# Patient Record
Sex: Male | Born: 1970 | Race: Black or African American | Hispanic: No | Marital: Single | Smoking: Current every day smoker
Health system: Southern US, Community
[De-identification: ages and names within clinical notes are randomized; demographics above are authoritative.]

## PROBLEM LIST (undated history)

## (undated) DIAGNOSIS — K852 Alcohol induced acute pancreatitis without necrosis or infection: Secondary | ICD-10-CM

## (undated) DIAGNOSIS — K219 Gastro-esophageal reflux disease without esophagitis: Secondary | ICD-10-CM

## (undated) HISTORY — PX: OTHER SURGICAL HISTORY: SHX169

---

## 2012-10-28 ENCOUNTER — Emergency Department: Payer: Self-pay | Admitting: Emergency Medicine

## 2012-10-28 LAB — URINALYSIS, COMPLETE
Bacteria: NONE SEEN
Bilirubin,UR: NEGATIVE
Blood: NEGATIVE
Nitrite: NEGATIVE
Protein: 30
RBC,UR: NONE SEEN /HPF (ref 0–5)
Specific Gravity: 1.023 (ref 1.003–1.030)

## 2012-10-28 LAB — COMPREHENSIVE METABOLIC PANEL
Albumin: 4.3 g/dL (ref 3.4–5.0)
Alkaline Phosphatase: 63 U/L (ref 50–136)
Anion Gap: 8 (ref 7–16)
BUN: 14 mg/dL (ref 7–18)
Bilirubin,Total: 0.7 mg/dL (ref 0.2–1.0)
Chloride: 104 mmol/L (ref 98–107)
Glucose: 93 mg/dL (ref 65–99)
Osmolality: 276 (ref 275–301)
SGOT(AST): 34 U/L (ref 15–37)
SGPT (ALT): 38 U/L (ref 12–78)
Total Protein: 8.7 g/dL — ABNORMAL HIGH (ref 6.4–8.2)

## 2012-10-28 LAB — CBC
HCT: 48.4 % (ref 40.0–52.0)
HGB: 16.5 g/dL (ref 13.0–18.0)
MCH: 32.4 pg (ref 26.0–34.0)
Platelet: 187 10*3/uL (ref 150–440)

## 2013-03-15 ENCOUNTER — Emergency Department: Payer: Self-pay | Admitting: Emergency Medicine

## 2016-12-19 ENCOUNTER — Inpatient Hospital Stay
Admission: EM | Admit: 2016-12-19 | Discharge: 2016-12-24 | DRG: 439 | Disposition: A | Discharge status: Home / Self Care | Payer: BLUE CROSS/BLUE SHIELD | Attending: Internal Medicine | Admitting: Internal Medicine

## 2016-12-19 ENCOUNTER — Encounter: Payer: Self-pay | Admitting: Emergency Medicine

## 2016-12-19 ENCOUNTER — Emergency Department: Payer: BLUE CROSS/BLUE SHIELD

## 2016-12-19 DIAGNOSIS — E876 Hypokalemia: Secondary | ICD-10-CM | POA: Diagnosis not present

## 2016-12-19 DIAGNOSIS — K852 Alcohol induced acute pancreatitis without necrosis or infection: Secondary | ICD-10-CM | POA: Diagnosis present

## 2016-12-19 DIAGNOSIS — E860 Dehydration: Secondary | ICD-10-CM | POA: Diagnosis present

## 2016-12-19 DIAGNOSIS — K219 Gastro-esophageal reflux disease without esophagitis: Secondary | ICD-10-CM | POA: Diagnosis present

## 2016-12-19 DIAGNOSIS — R030 Elevated blood-pressure reading, without diagnosis of hypertension: Secondary | ICD-10-CM | POA: Diagnosis present

## 2016-12-19 DIAGNOSIS — K859 Acute pancreatitis without necrosis or infection, unspecified: Secondary | ICD-10-CM | POA: Diagnosis present

## 2016-12-19 DIAGNOSIS — R109 Unspecified abdominal pain: Secondary | ICD-10-CM

## 2016-12-19 DIAGNOSIS — F1010 Alcohol abuse, uncomplicated: Secondary | ICD-10-CM | POA: Diagnosis present

## 2016-12-19 DIAGNOSIS — Z79899 Other long term (current) drug therapy: Secondary | ICD-10-CM

## 2016-12-19 DIAGNOSIS — F17200 Nicotine dependence, unspecified, uncomplicated: Secondary | ICD-10-CM | POA: Diagnosis present

## 2016-12-19 DIAGNOSIS — R739 Hyperglycemia, unspecified: Secondary | ICD-10-CM | POA: Diagnosis present

## 2016-12-19 DIAGNOSIS — G4700 Insomnia, unspecified: Secondary | ICD-10-CM | POA: Diagnosis present

## 2016-12-19 DIAGNOSIS — E871 Hypo-osmolality and hyponatremia: Secondary | ICD-10-CM | POA: Diagnosis present

## 2016-12-19 DIAGNOSIS — K5900 Constipation, unspecified: Secondary | ICD-10-CM

## 2016-12-19 HISTORY — DX: Gastro-esophageal reflux disease without esophagitis: K21.9

## 2016-12-19 LAB — URINALYSIS, COMPLETE (UACMP) WITH MICROSCOPIC
BACTERIA UA: NONE SEEN
BILIRUBIN URINE: NEGATIVE
Glucose, UA: >=500 mg/dL — AB
Hgb urine dipstick: NEGATIVE
KETONES UR: 5 mg/dL — AB
LEUKOCYTES UA: NEGATIVE
Nitrite: NEGATIVE
PROTEIN: 30 mg/dL — AB
RBC / HPF: NONE SEEN RBC/hpf (ref 0–5)
SQUAMOUS EPITHELIAL / LPF: NONE SEEN
Specific Gravity, Urine: 1.018 (ref 1.005–1.030)
pH: 5 (ref 5.0–8.0)

## 2016-12-19 LAB — COMPREHENSIVE METABOLIC PANEL
ALBUMIN: 4.6 g/dL (ref 3.5–5.0)
ALT: 47 U/L (ref 17–63)
ANION GAP: 9 (ref 5–15)
AST: 36 U/L (ref 15–41)
Alkaline Phosphatase: 48 U/L (ref 38–126)
BILIRUBIN TOTAL: 1 mg/dL (ref 0.3–1.2)
BUN: 8 mg/dL (ref 6–20)
CHLORIDE: 101 mmol/L (ref 101–111)
CO2: 24 mmol/L (ref 22–32)
Calcium: 9.2 mg/dL (ref 8.9–10.3)
Creatinine, Ser: 0.97 mg/dL (ref 0.61–1.24)
GFR calc Af Amer: >60 mL/min (ref >60)
GFR calc non Af Amer: >60 mL/min (ref >60)
Glucose, Bld: 130 mg/dL — ABNORMAL HIGH (ref 65–99)
POTASSIUM: 3.5 mmol/L (ref 3.5–5.1)
SODIUM: 134 mmol/L — AB (ref 135–145)
Total Protein: 7.8 g/dL (ref 6.5–8.1)

## 2016-12-19 LAB — CBC
HEMATOCRIT: 47.8 % (ref 40.0–52.0)
Hemoglobin: 16.4 g/dL (ref 13.0–18.0)
MCH: 32.9 pg (ref 26.0–34.0)
MCHC: 34.4 g/dL (ref 32.0–36.0)
MCV: 95.8 fL (ref 80.0–100.0)
Platelets: 199 10*3/uL (ref 150–440)
RBC: 4.99 MIL/uL (ref 4.40–5.90)
RDW: 13.1 % (ref 11.5–14.5)
WBC: 13.7 10*3/uL — AB (ref 3.8–10.6)

## 2016-12-19 LAB — LIPASE, BLOOD: LIPASE: 1587 U/L — AB (ref 11–51)

## 2016-12-19 MED ORDER — HYDRALAZINE HCL 20 MG/ML IJ SOLN: 10.0000 mg | Freq: Four times a day (QID) | INTRAMUSCULAR | Status: DC | PRN

## 2016-12-19 MED ORDER — FENTANYL CITRATE (PF) 100 MCG/2ML IJ SOLN
50.0000 ug | INTRAMUSCULAR | Status: DC | PRN
  Administered 2016-12-19: 50 ug via INTRAVENOUS

## 2016-12-19 MED ORDER — SODIUM CHLORIDE 0.9 % IV SOLN
INTRAVENOUS | Status: DC
  Administered 2016-12-19 – 2016-12-24 (×17): via INTRAVENOUS

## 2016-12-19 MED ORDER — LORAZEPAM 2 MG/ML IJ SOLN
1.0000 mg | Freq: Four times a day (QID) | INTRAMUSCULAR | Status: AC | PRN
  Administered 2016-12-19 – 2016-12-22 (×5): 1 mg via INTRAVENOUS
  Filled 2016-12-19 (×6): qty 1

## 2016-12-19 MED ORDER — ACETAMINOPHEN 650 MG RE SUPP: 650.0000 mg | Freq: Four times a day (QID) | RECTAL | Status: DC | PRN

## 2016-12-19 MED ORDER — ADULT MULTIVITAMIN W/MINERALS CH
1.0000 | ORAL_TABLET | Freq: Every day | ORAL | Status: DC
  Administered 2016-12-19 – 2016-12-24 (×6): 1 via ORAL
  Filled 2016-12-19 (×6): qty 1

## 2016-12-19 MED ORDER — ONDANSETRON HCL 4 MG/2ML IJ SOLN
4.0000 mg | Freq: Once | INTRAMUSCULAR | Status: AC
  Administered 2016-12-19: 4 mg via INTRAVENOUS

## 2016-12-19 MED ORDER — MORPHINE SULFATE (PF) 4 MG/ML IV SOLN
4.0000 mg | Freq: Once | INTRAVENOUS | Status: AC
  Administered 2016-12-19: 4 mg via INTRAVENOUS
  Filled 2016-12-19: qty 1

## 2016-12-19 MED ORDER — ONDANSETRON HCL 4 MG PO TABS: 4.0000 mg | ORAL_TABLET | Freq: Four times a day (QID) | ORAL | Status: DC | PRN

## 2016-12-19 MED ORDER — BISACODYL 5 MG PO TBEC
5.0000 mg | DELAYED_RELEASE_TABLET | Freq: Every day | ORAL | Status: DC | PRN
  Administered 2016-12-22: 5 mg via ORAL
  Filled 2016-12-19: qty 1

## 2016-12-19 MED ORDER — ACETAMINOPHEN 325 MG PO TABS
650.0000 mg | ORAL_TABLET | Freq: Four times a day (QID) | ORAL | Status: DC | PRN
  Administered 2016-12-19 – 2016-12-21 (×2): 650 mg via ORAL
  Filled 2016-12-19 (×2): qty 2

## 2016-12-19 MED ORDER — HYDROMORPHONE HCL 1 MG/ML IJ SOLN
INTRAMUSCULAR | Status: AC
  Filled 2016-12-19: qty 1

## 2016-12-19 MED ORDER — FENTANYL CITRATE (PF) 100 MCG/2ML IJ SOLN
INTRAMUSCULAR | Status: AC
  Filled 2016-12-19: qty 2

## 2016-12-19 MED ORDER — SENNOSIDES-DOCUSATE SODIUM 8.6-50 MG PO TABS
1.0000 | ORAL_TABLET | Freq: Every evening | ORAL | Status: DC | PRN
  Administered 2016-12-21: 1 via ORAL
  Filled 2016-12-19: qty 1

## 2016-12-19 MED ORDER — THIAMINE HCL 100 MG/ML IJ SOLN: 100.0000 mg | Freq: Every day | INTRAMUSCULAR | Status: DC

## 2016-12-19 MED ORDER — VITAMIN B-1 100 MG PO TABS
100.0000 mg | ORAL_TABLET | Freq: Every day | ORAL | Status: DC
  Administered 2016-12-19 – 2016-12-24 (×6): 100 mg via ORAL
  Filled 2016-12-19 (×6): qty 1

## 2016-12-19 MED ORDER — SODIUM CHLORIDE 0.9 % IV BOLUS (SEPSIS)
1000.0000 mL | Freq: Once | INTRAVENOUS | Status: AC
  Administered 2016-12-19: 1000 mL via INTRAVENOUS

## 2016-12-19 MED ORDER — LORAZEPAM 2 MG/ML IJ SOLN
0.0000 mg | Freq: Four times a day (QID) | INTRAMUSCULAR | Status: AC
  Administered 2016-12-19 – 2016-12-20 (×3): 1 mg via INTRAVENOUS
  Administered 2016-12-20: 2 mg via INTRAVENOUS
  Administered 2016-12-20: 1 mg via INTRAVENOUS
  Administered 2016-12-21: 2 mg via INTRAVENOUS
  Filled 2016-12-19 (×6): qty 1

## 2016-12-19 MED ORDER — LORAZEPAM 2 MG/ML IJ SOLN
0.0000 mg | Freq: Two times a day (BID) | INTRAMUSCULAR | Status: AC
  Administered 2016-12-21 – 2016-12-22 (×2): 1 mg via INTRAVENOUS
  Filled 2016-12-19: qty 2

## 2016-12-19 MED ORDER — ENOXAPARIN SODIUM 40 MG/0.4ML ~~LOC~~ SOLN
40.0000 mg | SUBCUTANEOUS | Status: DC
  Filled 2016-12-19 (×4): qty 0.4

## 2016-12-19 MED ORDER — FOLIC ACID 1 MG PO TABS
1.0000 mg | ORAL_TABLET | Freq: Every day | ORAL | Status: DC
  Administered 2016-12-19 – 2016-12-24 (×6): 1 mg via ORAL
  Filled 2016-12-19 (×6): qty 1

## 2016-12-19 MED ORDER — HYDROMORPHONE HCL 1 MG/ML IJ SOLN
1.0000 mg | INTRAMUSCULAR | Status: DC | PRN
  Administered 2016-12-19 – 2016-12-23 (×21): 1 mg via INTRAVENOUS
  Filled 2016-12-19 (×21): qty 1

## 2016-12-19 MED ORDER — LORAZEPAM 1 MG PO TABS
1.0000 mg | ORAL_TABLET | Freq: Four times a day (QID) | ORAL | Status: AC | PRN
  Filled 2016-12-19 (×2): qty 1

## 2016-12-19 MED ORDER — ONDANSETRON HCL 4 MG/2ML IJ SOLN
INTRAMUSCULAR | Status: AC
  Filled 2016-12-19: qty 2

## 2016-12-19 MED ORDER — HYDROMORPHONE HCL 1 MG/ML IJ SOLN
1.0000 mg | Freq: Once | INTRAMUSCULAR | Status: AC
  Administered 2016-12-19: 1 mg via INTRAVENOUS

## 2016-12-19 MED ORDER — ONDANSETRON HCL 4 MG/2ML IJ SOLN: 4.0000 mg | Freq: Four times a day (QID) | INTRAMUSCULAR | Status: DC | PRN

## 2016-12-19 MED ORDER — METOPROLOL TARTRATE 5 MG/5ML IV SOLN
5.0000 mg | INTRAVENOUS | Status: DC | PRN
  Administered 2016-12-20 – 2016-12-23 (×5): 5 mg via INTRAVENOUS
  Filled 2016-12-19 (×5): qty 5

## 2016-12-19 NOTE — ED Notes (Signed)
Pt continually removing blood pressure cuff. Pt instructed to keep cuff in place for monitoring.

## 2016-12-19 NOTE — ED Notes (Signed)
Pt complains of continued pain and is requesting additional pain medication.

## 2016-12-19 NOTE — ED Notes (Signed)
Pt's significant other out to desk to report pt continues to have pain 8/10. md notified, order for additional morphine 4mg  iv received.

## 2016-12-19 NOTE — ED Provider Notes (Signed)
[facility] Emergency Department Provider Note   ____________________________________________   First MD Initiated Contact with Patient 12/19/16 0402     (approximate)  I have reviewed the triage vital signs and the nursing notes.   HISTORY  Chief Complaint Abdominal Pain    HPI Don Thompson is a 46 y.o. male who comes into the hospital today with abdominal pain for the past 3 days. The patient has also had some sweats with nausea and vomiting. He denies any blood in his emesis but does have a history of ulcers. The patient's significant other reports that last September he had some pain but this is much worse the patient reports that he was drinking a lot prior to this pain starting. He reports that he's been drinking a lot of beer but its never been this bad in the past. The patient reports that he started taking some omeprazole, baking soda with vinegar and Tylenol but it did not help his pain. The patient was pain at 10 out of 10 in intensity. It is in his epigastric area and radiates into his back. The patient is here today for evaluation.   Past Medical History:  Diagnosis Date  . GERD (gastroesophageal reflux disease)     There are no active problems to display for this patient.   History reviewed. No pertinent surgical history.  Prior to Admission medications   Medication Sig Start Date End Date Taking? Authorizing Provider  famotidine (PEPCID) 20 MG tablet Take 20 mg by mouth daily.   Yes Historical Provider, MD  omeprazole (PRILOSEC) 20 MG capsule Take 20 mg by mouth daily.   Yes Historical Provider, MD    Allergies Patient has no known allergies.  History reviewed. No pertinent family history.  Social History Social History  Substance Use Topics  . Smoking status: Current Every Day Smoker  . Smokeless tobacco: Never Used  . Alcohol use Yes    Review of Systems  Constitutional: No fever/chills Eyes: No visual  changes. ENT: No sore throat. Cardiovascular: Denies chest pain. Respiratory: Denies shortness of breath. Gastrointestinal:  abdominal pain.   nausea,  vomiting.  No diarrhea.  No constipation. Genitourinary: Negative for dysuria. Musculoskeletal: Negative for back pain. Skin: Negative for rash. Neurological: Negative for headaches, focal weakness or numbness.   ____________________________________________   PHYSICAL EXAM:  VITAL SIGNS: ED Triage Vitals [12/19/16 0228]  Enc Vitals Group     BP (!) 118/53     Pulse Rate 89     Resp (!) 24     Temp 98.7 F (37.1 C)     Temp Source Oral     SpO2 96 %     Weight 210 lb (95.3 kg)     Height 5\' 5"  (1.651 m)     Head Circumference      Peak Flow      Pain Score 10     Pain Loc      Pain Edu?      Excl. in GC?    Constitutional: Alert and oriented. Well appearing and in moderate distress. Eyes: Conjunctivae are normal. PERRL. EOMI. Head: Atraumatic. Nose: No congestion/rhinnorhea. Mouth/Throat: Mucous membranes are moist.  Oropharynx non-erythematous. Cardiovascular: Normal rate, regular rhythm. Grossly normal heart sounds.  Good peripheral circulation. Respiratory: Normal respiratory effort.  No retractions. Lungs CTAB. Gastrointestinal: Soft with epigastric abd pain to palpation No distention. Positive bowel sounds Musculoskeletal: No lower extremity tenderness nor edema.   Neurologic:  Normal speech and language.  Skin:  Skin is warm, dry and intact.  Psychiatric: Mood and affect are normal.   ____________________________________________   LABS (all labs ordered are listed, but only abnormal results are displayed)  Labs Reviewed  LIPASE, BLOOD - Abnormal; Notable for the following:       Result Value   Lipase 1,587 (*)    All other components within normal limits  COMPREHENSIVE METABOLIC PANEL - Abnormal; Notable for the following:    Sodium 134 (*)    Glucose, Bld 130 (*)    All other components within normal  limits  CBC - Abnormal; Notable for the following:    WBC 13.7 (*)    All other components within normal limits  URINALYSIS, COMPLETE (UACMP) WITH MICROSCOPIC - Abnormal; Notable for the following:    Color, Urine YELLOW (*)    APPearance CLEAR (*)    Glucose, UA >=500 (*)    Ketones, ur 5 (*)    Protein, ur 30 (*)    All other components within normal limits   ____________________________________________  EKG  none ____________________________________________  RADIOLOGY  [place] abd ____________________________________________   PROCEDURES  Procedure(s) performed: None  Procedures  Critical Care performed: No  ____________________________________________   INITIAL IMPRESSION / ASSESSMENT AND PLAN / ED COURSE  Pertinent labs & imaging results that were available during my care of the patient were reviewed by me and considered in my medical decision making (see chart for details).  This is a 46 year old male who comes into the hospital today with abdominal pain. The patient does have a lipase that over 1500 with a concern for acute pancreatitis. The patient has never had this before. The patient also reports that he has been drinking prior to the beginning of this course. I did send the patient for an ultrasound to confirm that this is not due to gallstones and his ultrasound is negative. The patient received a dose of fentanyl, 2 doses of morphine and dose of Dilaudid. He also received 2 L of normal saline. The patient will be admitted to the hospitalist service.  Clinical Course as of Dec 19 636  Sat Dec 19, 2016  1610 Echogenic liver parenchyma, likely fatty infiltration. Normal gallbladder and bile ducts.   US Abdomen Limited RUQ [AW]    Clinical Course User Index [AW] Rebecka Apley, MD     ____________________________________________   FINAL CLINICAL IMPRESSION(S) / ED DIAGNOSES  Final diagnoses:  Abdominal pain  Alcohol-induced acute pancreatitis,  unspecified complication status      NEW MEDICATIONS STARTED DURING THIS VISIT:  New Prescriptions   No medications on file     Note:  This document was prepared using Dragon voice recognition software and may include unintentional dictation errors.    Rebecka Apley, MD 12/19/16 [phone]

## 2016-12-19 NOTE — ED Triage Notes (Addendum)
Pt ambulatory to triage in discomfort, report generalized abd pain x 3 days, vomit x 1 today, significant other states pt has had blood in BM and vomit, pt states has not had this in several days.  Pt reports taking tylenol, pepcid, omeprazole, and baking soda/vinegar w/o relief

## 2016-12-19 NOTE — ED Notes (Signed)
Pt is a chronic etoh user, pt's significant other states pt with last etoh 2-3 days pta. SO states pt with emesis x1 with "blood in it" yesterday. Pt complains of luq and ruq pain. Pt is diaphoretic at this time and yelling out in pain. Pt appears in obvious distress. Skin normal color, cool, diaphoretic. Last bowel movement yesterday. Pt's SO states pt has a history of "bleeding ulcers in his stomach".

## 2016-12-19 NOTE — ED Notes (Signed)
Pt states "it feels better, but it still hurts". Pt is less diaphoretic.

## 2016-12-19 NOTE — H&P (Addendum)
[facility] [place] [facility]   PATIENT NAME: Don Thompson    MR#:  [id number]  DATE OF BIRTH:  [birth year]  DATE OF ADMISSION:  12/19/2016  PRIMARY CARE PHYSICIAN: Patient, No Pcp Per   REQUESTING/REFERRING PHYSICIAN: dr Zenda Alpers  CHIEF COMPLAINT:   Abdominal pain HISTORY OF PRESENT ILLNESS:  Don Thompson  is a 46 y.o. male with a known history of EtOH and tobacco dependence who presents with midepigastric abdominal pain for the past 3 days. She reports drinking one to 340 ounce beers a day as well as half pint to 1 pint of vodka on the weekends. His last increase Wednesday. Since Wednesday he has had midepigastric abdominal pain with some nausea and vomiting. He is unable to eat or drink due to abdominal pain. In the emergency room lipase level was greater than thousand. Morphine is helping with the pain. Right upper quad ultrasound was done which did not show evidence of gallstone pancreatitis  PAST MEDICAL HISTORY:   Past Medical History:  Diagnosis Date  . GERD (gastroesophageal reflux disease)     PAST SURGICAL HISTORY:  None  SOCIAL HISTORY:   Social History  Substance Use Topics  . Smoking status: Current Every Day Smoker  . Smokeless tobacco: Never Used  . Alcohol use Yes    FAMILY HISTORY:  Positive history for EtOH  DRUG ALLERGIES:  No Known Allergies  REVIEW OF SYSTEMS:   Review of Systems  Constitutional: Negative.  Negative for chills, fever and malaise/fatigue.  HENT: Negative.  Negative for ear discharge, ear pain, hearing loss, nosebleeds and sore throat.   Eyes: Negative.  Negative for blurred vision and pain.  Respiratory: Negative.  Negative for cough, hemoptysis, shortness of breath and wheezing.   Cardiovascular: Negative.  Negative for chest pain, palpitations and leg swelling.  Gastrointestinal: Positive for abdominal pain, nausea and vomiting. Negative for blood in stool and diarrhea.  Genitourinary: Negative.   Negative for dysuria.  Musculoskeletal: Negative.  Negative for back pain.  Skin: Negative.   Neurological: Negative for dizziness, tremors, speech change, focal weakness, seizures and headaches.  Endo/Heme/Allergies: Negative.  Does not bruise/bleed easily.  Psychiatric/Behavioral: Negative.  Negative for depression, hallucinations and suicidal ideas.    MEDICATIONS AT HOME:   Prior to Admission medications   Medication Sig Start Date End Date Taking? Authorizing Provider  famotidine (PEPCID) 20 MG tablet Take 20 mg by mouth daily.   Yes [provider]  omeprazole (PRILOSEC) 20 MG capsule Take 20 mg by mouth daily.   Yes [provider]      VITAL SIGNS:  Blood pressure (!) 145/79, pulse 76, temperature 98.7 F (37.1 C), temperature source Oral, resp. rate (!) 24, height 5\' 5"  (1.651 m), weight 95.3 kg (210 lb), SpO2 98 %.  PHYSICAL EXAMINATION:   Physical Exam  Constitutional: He is oriented to person, place, and time. He appears distressed.  HENT:  Head: Normocephalic.  Eyes: No scleral icterus.  Neck: Normal range of motion. Neck supple. No JVD present. No tracheal deviation present.  Cardiovascular: Normal rate, regular rhythm and normal heart sounds.  Exam reveals no gallop and no friction rub.   No murmur heard. Pulmonary/Chest: Effort normal and breath sounds normal. No respiratory distress. He has no wheezes. He has no rales. He exhibits no tenderness.  Abdominal: Soft. Bowel sounds are normal. He exhibits no distension and no mass. There is tenderness. There is no rebound and no guarding.  Musculoskeletal: Normal range of motion.  He exhibits no edema.  Neurological: He is alert and oriented to person, place, and time.  Skin: Skin is warm. No rash noted. No erythema.  Psychiatric: Affect and judgment normal.      LABORATORY PANEL:   CBC  Recent Labs Lab 12/19/16 0228  WBC 13.7*  HGB 16.4  HCT 47.8  PLT 199    ------------------------------------------------------------------------------------------------------------------  Chemistries   Recent Labs Lab 12/19/16 0228  NA 134*  K 3.5  CL 101  CO2 24  GLUCOSE 130*  BUN 8  CREATININE 0.97  CALCIUM 9.2  AST 36  ALT 47  ALKPHOS 48  BILITOT 1.0   ------------------------------------------------------------------------------------------------------------------  Cardiac Enzymes No results for input(s): TROPONINI in the last 168 hours. ------------------------------------------------------------------------------------------------------------------  RADIOLOGY:  [place] Abdomen Limited Ruq  Result Date: 12/19/2016 CLINICAL DATA:  Generalized abdominal pain for 3 days EXAM: US ABDOMEN LIMITED - RIGHT UPPER QUADRANT COMPARISON:  None. FINDINGS: Gallbladder: No gallstones or wall thickening visualized. No sonographic Murphy sign noted by sonographer. Common bile duct: Diameter: 3.4 mm Liver: Generalized hepatic parenchymal echogenicity consistent with fatty infiltration. No focal liver lesion. IMPRESSION: Echogenic liver parenchyma, likely fatty infiltration. Normal gallbladder and bile ducts. Electronically Signed   By: Ellery Plunkaniel R Carneiro M.D.   On: 12/19/2016 06:22    EKG:   Orders placed or performed in visit on 10/28/12  . EKG 12-Lead    IMPRESSION AND PLAN:   46 year old male with EtOH and tobacco dependence who presents with midepigastric abdominal pain and elevated lipase consistent with pancreatitis.  1. Acute EtOH related pancreatitis with negative right upper quad ultrasound for gallstone pancreatitis. Patient will be nothing by mouth Aggressive IV hydration Monitor electrolytes, calcium and hemoglobin Pain control and when necessary antiemetics Lipase level in a.m. 2. EtOH abuse: Patient placed on CIWA protocol  3. Insomnia: Psychiatry consultation  4. Tobacco dependence: Patient is encouraged to quit smoking. Counseling was  provided for 4 minutes.  5. Hyperglycemia: Check hemoglobin A1c  6. Mild hyponatremia from nausea and vomiting and pancreatitis Continue IV fluids and repeat in a.m.   All the records are reviewed and case discussed with ED provider. Management plans discussed with the patient and he is in agreement  CODE STATUS: full  TOTAL TIME TAKING CARE OF THIS PATIENT: 45 minutes.    Pernell Dikes M.D on 12/19/2016 at 7:15 AM  Between 7am to 6pm - Pager - [phone]  After 6pm go to [email or web address] - password [email or web address]  [facility]  Office  [phone]  CC: Primary care physician; Patient, No Pcp Per

## 2016-12-19 NOTE — Progress Notes (Signed)
Upon Admission pt girlfriend states that the pt's 45 year old son was hit by a train (in [place] and killed last August 2017.  She states that the pt has visited rehab before and was sober for 6 months but since the accident his consumption of alcohol has increased.

## 2016-12-19 NOTE — Consult Note (Signed)
[facility] Consult   Reason for Consult:  Consult for 46 year old man currently in the hospital with pancreatitis. Question about alcohol abuse. Referring Physician:  Mody Patient Identification: Don Thompson MRN:  [id number] Principal Diagnosis: Alcohol abuse Diagnosis:   Patient Active Problem List   Diagnosis Date Noted  . Pancreatitis [K85.90] 12/19/2016  . Alcohol abuse [F10.10] 12/19/2016    Total Time spent with patient: 30 minutes  Subjective:   Don Thompson is a 46 y.o. male patient admitted with "my stomach hurts".  HPI:  Patient interviewed. Chart reviewed including old notes from outside hospitals. Patient presented to the emergency room with acute abdominal pain. On interview today the patient says his stomach started hurting badly yesterday. Last drink was probably a couple days ago. Patient says he is a heavy drinker. Declines to speculate about how much she drinks on a typical day. Patient currently states that he has no other complaints other than his abdominal pain. Not having any hallucinations. Mood is feeling better since getting into the hospital. Denies suicidal or homicidal thoughts.  Social history: Patient is currently employed. Seems to have pretty extensive family support judging by the number of people present in the room this afternoon.  Medical history: This appears to be his first history episode of pancreatitis. Long-standing problems with alcohol abuse.  Substance abuse history: Long-standing alcohol problems going back years. Had an admission to [facility] 2 years ago for alcohol abuse. At that time also was having a lot of mood and behavior problems related to alcohol. Patient denies any history of withdrawal seizures. Denies any history of delirium tremens. Has never really participated in outpatient substance abuse treatment. Also has a history of heavy marijuana use.  Past Psychiatric History: Patient has a history of suicidal  threats and agitated behavior probably related with blacking out. Judging from the notes from 2016 it sounds like the problems were thought to be specifically related to heavy drinking. Patient denies any current suicidal intent or plan. Not currently getting any outpatient mental health treatment.  Risk to Self: Is patient at risk for suicide?: No Risk to Others:   Prior Inpatient Therapy:   Prior Outpatient Therapy:    Past Medical History:  Past Medical History:  Diagnosis Date  . GERD (gastroesophageal reflux disease)    History reviewed. No pertinent surgical history. Family History: History reviewed. No pertinent family history. Family Psychiatric  History: Unknown Social History:  History  Alcohol Use  . Yes     History  Drug use: Unknown    Social History   Social History  . Marital status: Single    Spouse name: N/A  . Number of children: N/A  . Years of education: N/A   Social History Main Topics  . Smoking status: Current Every Day Smoker  . Smokeless tobacco: Never Used  . Alcohol use Yes  . Drug use: Unknown  . Sexual activity: Not Asked   Other Topics Concern  . None   Social History Narrative  . None   Additional Social History:    Allergies:  No Known Allergies  Labs:  Results for orders placed or performed during the hospital encounter of 12/19/16 (from the past 48 hour(s))  Lipase, blood     Status: Abnormal   Collection Time: 12/19/16  2:28 AM  Result Value Ref Range   Lipase 1,587 (H) 11 - 51 U/L    Comment: RESULT CONFIRMED BY MANUAL DILUTION.PMH  Comprehensive metabolic panel  Status: Abnormal   Collection Time: 12/19/16  2:28 AM  Result Value Ref Range   Sodium 134 (L) 135 - 145 mmol/L   Potassium 3.5 3.5 - 5.1 mmol/L   Chloride 101 101 - 111 mmol/L   CO2 24 22 - 32 mmol/L   Glucose, Bld 130 (H) 65 - 99 mg/dL   BUN 8 6 - 20 mg/dL   Creatinine, Ser 0.97 0.61 - 1.24 mg/dL   Calcium 9.2 8.9 - 10.3 mg/dL   Total Protein 7.8 6.5 -  8.1 g/dL   Albumin 4.6 3.5 - 5.0 g/dL   AST 36 15 - 41 U/L   ALT 47 17 - 63 U/L   Alkaline Phosphatase 48 38 - 126 U/L   Total Bilirubin 1.0 0.3 - 1.2 mg/dL   GFR calc non Af Amer >60 >60 mL/min   GFR calc Af Amer >60 >60 mL/min    Comment: (NOTE) The eGFR has been calculated using the CKD EPI equation. This calculation has not been validated in all clinical situations. eGFR's persistently <60 mL/min signify possible Chronic Kidney Disease.    Anion gap 9 5 - 15  CBC     Status: Abnormal   Collection Time: 12/19/16  2:28 AM  Result Value Ref Range   WBC 13.7 (H) 3.8 - 10.6 K/uL   RBC 4.99 4.40 - 5.90 MIL/uL   Hemoglobin 16.4 13.0 - 18.0 g/dL   HCT 47.8 40.0 - 52.0 %   MCV 95.8 80.0 - 100.0 fL   MCH 32.9 26.0 - 34.0 pg   MCHC 34.4 32.0 - 36.0 g/dL   RDW 13.1 11.5 - 14.5 %   Platelets 199 150 - 440 K/uL  Urinalysis, Complete w Microscopic     Status: Abnormal   Collection Time: 12/19/16  2:28 AM  Result Value Ref Range   Color, Urine YELLOW (A) YELLOW   APPearance CLEAR (A) CLEAR   Specific Gravity, Urine 1.018 1.005 - 1.030   pH 5.0 5.0 - 8.0   Glucose, UA >=500 (A) NEGATIVE mg/dL   Hgb urine dipstick NEGATIVE NEGATIVE   Bilirubin Urine NEGATIVE NEGATIVE   Ketones, ur 5 (A) NEGATIVE mg/dL   Protein, ur 30 (A) NEGATIVE mg/dL   Nitrite NEGATIVE NEGATIVE   Leukocytes, UA NEGATIVE NEGATIVE   RBC / HPF NONE SEEN 0 - 5 RBC/hpf   WBC, UA 0-5 0 - 5 WBC/hpf   Bacteria, UA NONE SEEN NONE SEEN   Squamous Epithelial / LPF NONE SEEN NONE SEEN   Mucous PRESENT     Current Facility-Administered Medications  Medication Dose Route Frequency Provider Last Rate Last Dose  . 0.9 %  sodium chloride infusion   Intravenous Continuous Bettey Costa, MD 150 mL/hr at 12/19/16 3662    . acetaminophen (TYLENOL) tablet 650 mg  650 mg Oral Q6H PRN Bettey Costa, MD   650 mg at 12/19/16 1123   Or  . acetaminophen (TYLENOL) suppository 650 mg  650 mg Rectal Q6H PRN Mody, Sital, MD      . bisacodyl  (DULCOLAX) EC tablet 5 mg  5 mg Oral Daily PRN Mody, Sital, MD      . enoxaparin (LOVENOX) injection 40 mg  40 mg Subcutaneous Q24H Mody, Sital, MD      . fentaNYL (SUBLIMAZE) injection 50 mcg  50 mcg Intravenous Q30 min PRN Loney Hering, MD   Stopped at 12/19/16 1052  . folic acid (FOLVITE) tablet 1 mg  1 mg Oral Daily Bettey Costa, MD  1 mg at 12/19/16 1123  . hydrALAZINE (APRESOLINE) injection 10 mg  10 mg Intravenous Q6H PRN Bettey Costa, MD      . HYDROmorphone (DILAUDID) injection 1 mg  1 mg Intravenous Q4H PRN Bettey Costa, MD   1 mg at 12/19/16 1237  . LORazepam (ATIVAN) injection 0-4 mg  0-4 mg Intravenous Q6H Mody, Sital, MD   1 mg at 12/19/16 1432   Followed by  . [START ON 12/21/2016] LORazepam (ATIVAN) injection 0-4 mg  0-4 mg Intravenous Q12H Mody, Sital, MD      . LORazepam (ATIVAN) tablet 1 mg  1 mg Oral Q6H PRN Bettey Costa, MD       Or  . LORazepam (ATIVAN) injection 1 mg  1 mg Intravenous Q6H PRN Bettey Costa, MD   1 mg at 12/19/16 0831  . metoprolol (LOPRESSOR) injection 5 mg  5 mg Intravenous Q4H PRN Bettey Costa, MD      . multivitamin with minerals tablet 1 tablet  1 tablet Oral Daily Bettey Costa, MD   1 tablet at 12/19/16 1122  . ondansetron (ZOFRAN) tablet 4 mg  4 mg Oral Q6H PRN Bettey Costa, MD       Or  . ondansetron (ZOFRAN) injection 4 mg  4 mg Intravenous Q6H PRN Mody, Sital, MD      . senna-docusate (Senokot-S) tablet 1 tablet  1 tablet Oral QHS PRN Mody, Sital, MD      . thiamine (VITAMIN B-1) tablet 100 mg  100 mg Oral Daily Mody, Sital, MD   100 mg at 12/19/16 1123   Or  . thiamine (B-1) injection 100 mg  100 mg Intravenous Daily Bettey Costa, MD        Musculoskeletal: Strength & Muscle Tone: decreased Gait & Station: unable to stand Patient leans: N/A  Psychiatric Specialty Exam: Physical Exam  Nursing note and vitals reviewed. Constitutional: He appears well-developed and well-nourished.  HENT:  Head: Normocephalic and atraumatic.  Eyes:  Conjunctivae are normal. Pupils are equal, round, and reactive to light.  Neck: Normal range of motion.  Cardiovascular: Regular rhythm and normal heart sounds.   Respiratory: Effort normal. No respiratory distress.  GI: Soft. There is tenderness.  Musculoskeletal: Normal range of motion.  Neurological: He is alert.  Skin: Skin is warm and dry.  Psychiatric: He has a normal mood and affect. His speech is normal and behavior is normal. Judgment and thought content normal. Cognition and memory are normal.    Review of Systems  Constitutional: Negative.   HENT: Negative.   Eyes: Negative.   Respiratory: Negative.   Cardiovascular: Negative.   Gastrointestinal: Negative.   Musculoskeletal: Negative.   Skin: Negative.   Neurological: Negative.   Psychiatric/Behavioral: Positive for substance abuse. Negative for depression, hallucinations, memory loss and suicidal ideas. The patient is nervous/anxious and has insomnia.     Blood pressure (!) 145/95, pulse 76, temperature 98 F (36.7 C), temperature source Oral, resp. rate 19, height 5' 5"  (1.651 m), weight 95.3 kg (210 lb), SpO2 98 %.Body mass index is 34.95 kg/m.  General Appearance: Casual  Eye Contact:  Good  Speech:  Clear and Coherent  Volume:  Normal  Mood:  Euthymic  Affect:  Constricted  Thought Process:  Goal Directed  Orientation:  Full (Time, Place, and Person)  Thought Content:  Logical  Suicidal Thoughts:  No  Homicidal Thoughts:  No  Memory:  Immediate;   Fair Recent;   Fair Remote;   Fair  Judgement:  Fair  Insight:  Fair  Psychomotor Activity:  Normal  Concentration:  Concentration: Fair  Recall:  [employer] of Knowledge:  Fair  Language:  Fair  Akathisia:  No  Handed:  Right  AIMS (if indicated):     Assets:  Desire for Improvement Resilience Social Support  ADL's:  Intact  Cognition:  WNL  Sleep:        Treatment Plan Summary: Daily contact with patient to assess and evaluate symptoms and  progress in treatment, Medication management and Plan 46 year old man with alcohol abuse. No alcohol level or drug screen was done on admission making some interpretation difficult. Patient currently is lucid without any sign of delirium. Not tremulous. Not having hallucinations. Continue treatment of pancreatitis and when necessary medicines per the protocol for detox. Patient will benefit from referral to outpatient substance abuse treatment at discharge. I will follow-up as needed.  Disposition: Patient does not meet criteria for psychiatric inpatient admission. Supportive therapy provided about ongoing stressors.  Alethia Berthold, MD 12/19/2016 4:22 PM

## 2016-12-19 NOTE — ED Notes (Signed)
Hooked patient back up to monitor. 

## 2016-12-19 NOTE — ED Notes (Signed)
Report to valerie, rn.  

## 2016-12-19 NOTE — ED Notes (Signed)
hospitalist in to see pt.

## 2016-12-19 NOTE — ED Notes (Signed)
Gave patient a blanket. 

## 2016-12-20 LAB — BASIC METABOLIC PANEL
Anion gap: 9 (ref 5–15)
BUN: 7 mg/dL (ref 6–20)
CHLORIDE: 101 mmol/L (ref 101–111)
CO2: 22 mmol/L (ref 22–32)
CREATININE: 0.69 mg/dL (ref 0.61–1.24)
Calcium: 8.2 mg/dL — ABNORMAL LOW (ref 8.9–10.3)
GFR calc Af Amer: >60 mL/min (ref >60)
Glucose, Bld: 91 mg/dL (ref 65–99)
Potassium: 3.6 mmol/L (ref 3.5–5.1)
Sodium: 132 mmol/L — ABNORMAL LOW (ref 135–145)

## 2016-12-20 LAB — CBC
HEMATOCRIT: 45.6 % (ref 40.0–52.0)
Hemoglobin: 15.1 g/dL (ref 13.0–18.0)
MCH: 31.6 pg (ref 26.0–34.0)
MCHC: 33.2 g/dL (ref 32.0–36.0)
MCV: 95.1 fL (ref 80.0–100.0)
Platelets: 140 10*3/uL — ABNORMAL LOW (ref 150–440)
RBC: 4.79 MIL/uL (ref 4.40–5.90)
RDW: 13.3 % (ref 11.5–14.5)
WBC: 17.7 10*3/uL — ABNORMAL HIGH (ref 3.8–10.6)

## 2016-12-20 LAB — HEMOGLOBIN A1C
Hgb A1c MFr Bld: 5.6 % (ref 4.8–5.6)
Mean Plasma Glucose: 114 mg/dL

## 2016-12-20 LAB — MAGNESIUM: Magnesium: 1.7 mg/dL (ref 1.7–2.4)

## 2016-12-20 NOTE — Consult Note (Signed)
[facility]   Reason for Consult:  Consult for 46 year old man currently in the hospital with pancreatitis. Question about alcohol abuse. Referring Physician:  Mody Patient Identification: AMERY MINASYAN MRN:  [id number] Principal Diagnosis: Alcohol abuse Diagnosis:   Patient Active Problem List   Diagnosis Date Noted  . Pancreatitis [K85.90] 12/19/2016  . Alcohol abuse [F10.10] 12/19/2016    Total Time spent with patient: 30 minutes  Subjective:   ERYN KREJCI is a 46 y.o. male patient admitted with "my stomach hurts".  Follow-up today with this 46 year old man with a history of substance abuse and some mental health problems. Spoke with the patient and his wife today. Patient says he is feeling much better although still having abdominal pain. Emotionally he says he is feeling pretty stable. Denies any serious depression or suicidality. Not feeling hopeless. I reviewed with him some of his behavior and mood problems that he had had in the past. Patient says he is not currently seeing anybody for outpatient treatment. He had been prescribed some trazodone for sleep but doesn't find it particularly helpful. He is not currently psychotic not having DTs.  HPI:  Patient interviewed. Chart reviewed including old notes from outside hospitals. Patient presented to the emergency room with acute abdominal pain. On interview today the patient says his stomach started hurting badly yesterday. Last drink was probably a couple days ago. Patient says he is a heavy drinker. Declines to speculate about how much she drinks on a typical day. Patient currently states that he has no other complaints other than his abdominal pain. Not having any hallucinations. Mood is feeling better since getting into the hospital. Denies suicidal or homicidal thoughts.  Social history: Patient is currently employed. Seems to have pretty extensive family support judging by the number of people  present in the room this afternoon.  Medical history: This appears to be his first history episode of pancreatitis. Long-standing problems with alcohol abuse.  Substance abuse history: Long-standing alcohol problems going back years. Had an admission to [facility] 2 years ago for alcohol abuse. At that time also was having a lot of mood and behavior problems related to alcohol. Patient denies any history of withdrawal seizures. Denies any history of delirium tremens. Has never really participated in outpatient substance abuse treatment. Also has a history of heavy marijuana use.  Past Psychiatric History: Patient has a history of suicidal threats and agitated behavior probably related with blacking out. Judging from the notes from 2016 it sounds like the problems were thought to be specifically related to heavy drinking. Patient denies any current suicidal intent or plan. Not currently getting any outpatient mental health treatment.  Risk to Self: Is patient at risk for suicide?: No Risk to Others:   Prior Inpatient Therapy:   Prior Outpatient Therapy:    Past Medical History:  Past Medical History:  Diagnosis Date  . GERD (gastroesophageal reflux disease)    History reviewed. No pertinent surgical history. Family History: History reviewed. No pertinent family history. Family Psychiatric  History: Unknown Social History:  History  Alcohol Use  . Yes     History  Drug use: Unknown    Social History   Social History  . Marital status: Single    Spouse name: N/A  . Number of children: N/A  . Years of education: N/A   Social History Main Topics  . Smoking status: Current Every Day Smoker  . Smokeless tobacco: Never Used  . Alcohol use Yes  .  Drug use: Unknown  . Sexual activity: Not Asked   Other Topics Concern  . None   Social History Narrative  . None   Additional Social History:    Allergies:  No Known Allergies  Labs:  Results for orders placed or performed during the  hospital encounter of 12/19/16 (from the past 48 hour(s))  Lipase, blood     Status: Abnormal   Collection Time: 12/19/16  2:28 AM  Result Value Ref Range   Lipase 1,587 (H) 11 - 51 U/L    Comment: RESULT CONFIRMED BY MANUAL DILUTION.PMH  Comprehensive metabolic panel     Status: Abnormal   Collection Time: 12/19/16  2:28 AM  Result Value Ref Range   Sodium 134 (L) 135 - 145 mmol/L   Potassium 3.5 3.5 - 5.1 mmol/L   Chloride 101 101 - 111 mmol/L   CO2 24 22 - 32 mmol/L   Glucose, Bld 130 (H) 65 - 99 mg/dL   BUN 8 6 - 20 mg/dL   Creatinine, Ser 0.97 0.61 - 1.24 mg/dL   Calcium 9.2 8.9 - 10.3 mg/dL   Total Protein 7.8 6.5 - 8.1 g/dL   Albumin 4.6 3.5 - 5.0 g/dL   AST 36 15 - 41 U/L   ALT 47 17 - 63 U/L   Alkaline Phosphatase 48 38 - 126 U/L   Total Bilirubin 1.0 0.3 - 1.2 mg/dL   GFR calc non Af Amer >60 >60 mL/min   GFR calc Af Amer >60 >60 mL/min    Comment: (NOTE) The eGFR has been calculated using the CKD EPI equation. This calculation has not been validated in all clinical situations. eGFR's persistently <60 mL/min signify possible Chronic Kidney Disease.    Anion gap 9 5 - 15  CBC     Status: Abnormal   Collection Time: 12/19/16  2:28 AM  Result Value Ref Range   WBC 13.7 (H) 3.8 - 10.6 K/uL   RBC 4.99 4.40 - 5.90 MIL/uL   Hemoglobin 16.4 13.0 - 18.0 g/dL   HCT 47.8 40.0 - 52.0 %   MCV 95.8 80.0 - 100.0 fL   MCH 32.9 26.0 - 34.0 pg   MCHC 34.4 32.0 - 36.0 g/dL   RDW 13.1 11.5 - 14.5 %   Platelets 199 150 - 440 K/uL  Urinalysis, Complete w Microscopic     Status: Abnormal   Collection Time: 12/19/16  2:28 AM  Result Value Ref Range   Color, Urine YELLOW (A) YELLOW   APPearance CLEAR (A) CLEAR   Specific Gravity, Urine 1.018 1.005 - 1.030   pH 5.0 5.0 - 8.0   Glucose, UA >=500 (A) NEGATIVE mg/dL   Hgb urine dipstick NEGATIVE NEGATIVE   Bilirubin Urine NEGATIVE NEGATIVE   Ketones, ur 5 (A) NEGATIVE mg/dL   Protein, ur 30 (A) NEGATIVE mg/dL   Nitrite NEGATIVE  NEGATIVE   Leukocytes, UA NEGATIVE NEGATIVE   RBC / HPF NONE SEEN 0 - 5 RBC/hpf   WBC, UA 0-5 0 - 5 WBC/hpf   Bacteria, UA NONE SEEN NONE SEEN   Squamous Epithelial / LPF NONE SEEN NONE SEEN   Mucous PRESENT   Hemoglobin A1c     Status: None   Collection Time: 12/19/16  2:28 AM  Result Value Ref Range   Hgb A1c MFr Bld 5.6 4.8 - 5.6 %    Comment: (NOTE)         Pre-diabetes: 5.7 - 6.4  Diabetes: >6.4         Glycemic control for adults with diabetes: <7.0    Mean Plasma Glucose 114 mg/dL    Comment: (NOTE) Performed At: [facility] [place], [place] [id number] Lindon Romp MD [id number]   Basic metabolic panel     Status: Abnormal   Collection Time: 12/20/16  6:01 AM  Result Value Ref Range   Sodium 132 (L) 135 - 145 mmol/L   Potassium 3.6 3.5 - 5.1 mmol/L   Chloride 101 101 - 111 mmol/L   CO2 22 22 - 32 mmol/L   Glucose, Bld 91 65 - 99 mg/dL   BUN 7 6 - 20 mg/dL   Creatinine, Ser 0.69 0.61 - 1.24 mg/dL   Calcium 8.2 (L) 8.9 - 10.3 mg/dL   GFR calc non Af Amer >60 >60 mL/min   GFR calc Af Amer >60 >60 mL/min    Comment: (NOTE) The eGFR has been calculated using the CKD EPI equation. This calculation has not been validated in all clinical situations. eGFR's persistently <60 mL/min signify possible Chronic Kidney Disease.    Anion gap 9 5 - 15  CBC     Status: Abnormal   Collection Time: 12/20/16  6:01 AM  Result Value Ref Range   WBC 17.7 (H) 3.8 - 10.6 K/uL   RBC 4.79 4.40 - 5.90 MIL/uL   Hemoglobin 15.1 13.0 - 18.0 g/dL   HCT 45.6 40.0 - 52.0 %   MCV 95.1 80.0 - 100.0 fL   MCH 31.6 26.0 - 34.0 pg   MCHC 33.2 32.0 - 36.0 g/dL   RDW 13.3 11.5 - 14.5 %   Platelets 140 (L) 150 - 440 K/uL  Magnesium     Status: None   Collection Time: 12/20/16  6:01 AM  Result Value Ref Range   Magnesium 1.7 1.7 - 2.4 mg/dL    Current Facility-Administered Medications  Medication Dose Route Frequency Provider Last Rate Last Dose  . 0.9 %   sodium chloride infusion   Intravenous Continuous Bettey Costa, MD 175 mL/hr at 12/20/16 0806    . acetaminophen (TYLENOL) tablet 650 mg  650 mg Oral Q6H PRN Bettey Costa, MD   650 mg at 12/19/16 1123   Or  . acetaminophen (TYLENOL) suppository 650 mg  650 mg Rectal Q6H PRN Mody, Sital, MD      . bisacodyl (DULCOLAX) EC tablet 5 mg  5 mg Oral Daily PRN Mody, Sital, MD      . enoxaparin (LOVENOX) injection 40 mg  40 mg Subcutaneous Q24H Mody, Sital, MD      . fentaNYL (SUBLIMAZE) injection 50 mcg  50 mcg Intravenous Q30 min PRN Loney Hering, MD   Stopped at 12/19/16 1052  . folic acid (FOLVITE) tablet 1 mg  1 mg Oral Daily Bettey Costa, MD   1 mg at 12/20/16 0806  . hydrALAZINE (APRESOLINE) injection 10 mg  10 mg Intravenous Q6H PRN Mody, Sital, MD      . HYDROmorphone (DILAUDID) injection 1 mg  1 mg Intravenous Q4H PRN Bettey Costa, MD   1 mg at 12/20/16 1413  . LORazepam (ATIVAN) injection 0-4 mg  0-4 mg Intravenous Q6H Mody, Sital, MD   1 mg at 12/20/16 1247   Followed by  . [START ON 12/21/2016] LORazepam (ATIVAN) injection 0-4 mg  0-4 mg Intravenous Q12H Mody, Sital, MD      . LORazepam (ATIVAN) tablet 1 mg  1 mg Oral Q6H PRN  Bettey Costa, MD       Or  . LORazepam (ATIVAN) injection 1 mg  1 mg Intravenous Q6H PRN Bettey Costa, MD   1 mg at 12/20/16 0620  . metoprolol (LOPRESSOR) injection 5 mg  5 mg Intravenous Q4H PRN Bettey Costa, MD      . multivitamin with minerals tablet 1 tablet  1 tablet Oral Daily Bettey Costa, MD   1 tablet at 12/20/16 0806  . ondansetron (ZOFRAN) tablet 4 mg  4 mg Oral Q6H PRN Bettey Costa, MD       Or  . ondansetron (ZOFRAN) injection 4 mg  4 mg Intravenous Q6H PRN Mody, Sital, MD      . senna-docusate (Senokot-S) tablet 1 tablet  1 tablet Oral QHS PRN Mody, Sital, MD      . thiamine (VITAMIN B-1) tablet 100 mg  100 mg Oral Daily Mody, Sital, MD   100 mg at 12/20/16 8527   Or  . thiamine (B-1) injection 100 mg  100 mg Intravenous Daily Bettey Costa, MD         Musculoskeletal: Strength & Muscle Tone: decreased Gait & Station: unable to stand Patient leans: N/A  Psychiatric Specialty Exam: Physical Exam  Nursing note and vitals reviewed. Constitutional: He appears well-developed and well-nourished.  HENT:  Head: Normocephalic and atraumatic.  Eyes: Conjunctivae are normal. Pupils are equal, round, and reactive to light.  Neck: Normal range of motion.  Cardiovascular: Regular rhythm and normal heart sounds.   Respiratory: Effort normal. No respiratory distress.  GI: Soft. There is tenderness.  Musculoskeletal: Normal range of motion.  Neurological: He is alert.  Skin: Skin is warm and dry.  Psychiatric: He has a normal mood and affect. His speech is normal and behavior is normal. Judgment and thought content normal. Cognition and memory are normal.    Review of Systems  Constitutional: Negative.   HENT: Negative.   Eyes: Negative.   Respiratory: Negative.   Cardiovascular: Negative.   Gastrointestinal: Negative.   Musculoskeletal: Negative.   Skin: Negative.   Neurological: Negative.   Psychiatric/Behavioral: Positive for substance abuse. Negative for depression, hallucinations, memory loss and suicidal ideas. The patient is nervous/anxious and has insomnia.     Blood pressure (!) 151/94, pulse 77, temperature 99.7 F (37.6 C), temperature source Oral, resp. rate (!) 24, height 5' 5"  (1.651 m), weight 95.3 kg (210 lb), SpO2 98 %.Body mass index is 34.95 kg/m.  General Appearance: Casual  Eye Contact:  Good  Speech:  Clear and Coherent  Volume:  Normal  Mood:  Euthymic  Affect:  Constricted  Thought Process:  Goal Directed  Orientation:  Full (Time, Place, and Person)  Thought Content:  Logical  Suicidal Thoughts:  No  Homicidal Thoughts:  No  Memory:  Immediate;   Fair Recent;   Fair Remote;   Fair  Judgement:  Fair  Insight:  Fair  Psychomotor Activity:  Normal  Concentration:  Concentration: Fair  Recall:  [employer] of Knowledge:  Fair  Language:  Fair  Akathisia:  No  Handed:  Right  AIMS (if indicated):     Assets:  Desire for Improvement Resilience Social Support  ADL's:  Intact  Cognition:  WNL  Sleep:        Treatment Plan Summary: Daily contact with patient to assess and evaluate symptoms and progress in treatment, Medication management and Plan Supportive counseling with patient and wife. Apparently he has had a lot of stress even more than  usual in the last year. Had a son who died last summer. Wife reports that he is minimizing symptoms and is often more depressed than he puts on. Prior to discharge at think we should see if we can get him hooked up with local mental health providers. Meanwhile continue detox medicines and treatment as usual.  Disposition: Patient does not meet criteria for psychiatric inpatient admission. Supportive therapy provided about ongoing stressors.  Alethia Berthold, MD 12/20/2016 2:47 PM

## 2016-12-20 NOTE — Progress Notes (Signed)
[facility] - [place] at [facility]   PATIENT NAME: Don Thompson    MR#:  [id number]  DATE OF BIRTH:  04-09-1971  SUBJECTIVE:   Still with abdominal pain this am Better with pain meds No nausea  REVIEW OF SYSTEMS:    Review of Systems  Constitutional: Negative for fever, chills weight loss HENT: Negative for ear pain, nosebleeds, congestion, facial swelling, rhinorrhea, neck pain, neck stiffness and ear discharge.   Respiratory: Negative for cough, shortness of breath, wheezing  Cardiovascular: Negative for chest pain, palpitations and leg swelling.  Gastrointestinal: Negative for heartburn, ++abdominal pain, NO vomiting, diarrhea or consitpation Genitourinary: Negative for dysuria, urgency, frequency, hematuria Musculoskeletal: Negative for back pain or joint pain Neurological: Negative for dizziness, seizures, syncope, focal weakness,  numbness and headaches.  Some tremors Hematological: Does not bruise/bleed easily.  Psychiatric/Behavioral: Negative for hallucinations, confusion, dysphoric mood    Tolerating Diet: NPO      DRUG ALLERGIES:  No Known Allergies  VITALS:  Blood pressure (!) 151/94, pulse 77, temperature 99.7 F (37.6 C), temperature source Oral, resp. rate (!) 24, height 5\' 5"  (1.651 m), weight 95.3 kg (210 lb), SpO2 98 %.  PHYSICAL EXAMINATION:  Constitutional: Appears well-developed and well-nourished. No distress. HENT: Normocephalic. Marland Kitchen Oropharynx is clear and moist.  Eyes: Conjunctivae and EOM are normal. PERRLA, no scleral icterus.  Neck: Normal ROM. Neck supple. No JVD. No tracheal deviation. CVS: RRR, S1/S2 +, no murmurs, no gallops, no carotid bruit.  Pulmonary: Effort and breath sounds normal, no stridor, rhonchi, wheezes, rales.  Abdominal: Soft. BS +,  no distension, mild generalized tenderness, NO rebound or guarding.  Musculoskeletal: Normal range of motion. No edema and no tenderness.  Neuro: Alert. CN 2-12 grossly  intact. No focal deficits. Skin: Skin is warm and dry. No rash noted. Psychiatric: Normal mood and affect.  No asterixis, tremors present this am    LABORATORY PANEL:   CBC  Recent Labs Lab 12/20/16 0601  WBC 17.7*  HGB 15.1  HCT 45.6  PLT 140*   ------------------------------------------------------------------------------------------------------------------  Chemistries   Recent Labs Lab 12/19/16 0228  NA 134*  K 3.5  CL 101  CO2 24  GLUCOSE 130*  BUN 8  CREATININE 0.97  CALCIUM 9.2  AST 36  ALT 47  ALKPHOS 48  BILITOT 1.0   ------------------------------------------------------------------------------------------------------------------  Cardiac Enzymes No results for input(s): TROPONINI in the last 168 hours. ------------------------------------------------------------------------------------------------------------------  RADIOLOGY:  US Abdomen Limited Ruq  Result Date: 12/19/2016 CLINICAL DATA:  Generalized abdominal pain for 3 days EXAM: US ABDOMEN LIMITED - RIGHT UPPER QUADRANT COMPARISON:  None. FINDINGS: Gallbladder: No gallstones or wall thickening visualized. No sonographic Murphy sign noted by sonographer. Common bile duct: Diameter: 3.4 mm Liver: Generalized hepatic parenchymal echogenicity consistent with fatty infiltration. No focal liver lesion. IMPRESSION: Echogenic liver parenchyma, likely fatty infiltration. Normal gallbladder and bile ducts. Electronically Signed   By: Ellery Plunk M.D.   On: 12/19/2016 06:22     ASSESSMENT AND PLAN:    46 year old male with EtOH and tobacco dependence who presents with midepigastric abdominal pain and elevated lipase consistent with pancreatitis.  1. Acute EtOH related pancreatitis with negative right upper quad ultrasound for gallstone pancreatitis Continue supportive care antiemetics, pain meds Increase IVF (dark urine) NPO until relatively pain free  2. EtOH abuse:Continue CIWA protocol He  would benefit from outpatient substance abuse at discharge.  3 Tobacco dependence: Patient is encouraged to quit smoking.   4 Hyperglycemia:Hemoglobin A1c pending  5.  Mild hyponatremia from nausea and vomiting and pancreatitis Continue IV fluids  Follow up this am labs   6. Elevated WBC from pancreatitis/dehydration: Increase IVF    Management plans discussed with the patient and he is in agreement.  CODE STATUS: full  TOTAL TIME TAKING CARE OF THIS PATIENT: 30 minutes.     POSSIBLE D/C 3-4 days, DEPENDING ON CLINICAL CONDITION.   Jusitn Salsgiver M.D on 12/20/2016 at 6:42 AM  Between 7am to 6pm - Pager - [phone] After 6pm go to [email or web address] - password [email or web address]  [facility] [place] [facility]  Office  [phone]  CC: Primary care physician; Patient, No Pcp Per  Note: This dictation was prepared with Dragon dictation along with smaller phrase technology. Any transcriptional errors that result from this process are unintentional.

## 2016-12-21 ENCOUNTER — Inpatient Hospital Stay: Payer: BLUE CROSS/BLUE SHIELD

## 2016-12-21 LAB — BASIC METABOLIC PANEL
ANION GAP: 8 (ref 5–15)
BUN: 9 mg/dL (ref 6–20)
CALCIUM: 8.3 mg/dL — AB (ref 8.9–10.3)
CHLORIDE: 104 mmol/L (ref 101–111)
CO2: 21 mmol/L — AB (ref 22–32)
Creatinine, Ser: 0.73 mg/dL (ref 0.61–1.24)
GFR calc non Af Amer: >60 mL/min (ref >60)
Glucose, Bld: 85 mg/dL (ref 65–99)
POTASSIUM: 3.5 mmol/L (ref 3.5–5.1)
Sodium: 133 mmol/L — ABNORMAL LOW (ref 135–145)

## 2016-12-21 LAB — MAGNESIUM: MAGNESIUM: 1.9 mg/dL (ref 1.7–2.4)

## 2016-12-21 LAB — CBC
HEMATOCRIT: 37.6 % — AB (ref 40.0–52.0)
HEMOGLOBIN: 13.2 g/dL (ref 13.0–18.0)
MCH: 33.4 pg (ref 26.0–34.0)
MCHC: 35 g/dL (ref 32.0–36.0)
MCV: 95.4 fL (ref 80.0–100.0)
Platelets: 134 10*3/uL — ABNORMAL LOW (ref 150–440)
RBC: 3.94 MIL/uL — AB (ref 4.40–5.90)
RDW: 13.2 % (ref 11.5–14.5)
WBC: 15.4 10*3/uL — ABNORMAL HIGH (ref 3.8–10.6)

## 2016-12-21 LAB — HIV ANTIBODY (ROUTINE TESTING W REFLEX): HIV Screen 4th Generation wRfx: NONREACTIVE

## 2016-12-21 LAB — LIPASE, BLOOD: Lipase: 38 U/L (ref 11–51)

## 2016-12-21 MED ORDER — SENNA 8.6 MG PO TABS
1.0000 | ORAL_TABLET | Freq: Every day | ORAL | Status: DC
  Administered 2016-12-21 – 2016-12-23 (×3): 8.6 mg via ORAL
  Filled 2016-12-21 (×4): qty 1

## 2016-12-21 MED ORDER — DOCUSATE SODIUM 100 MG PO CAPS
200.0000 mg | ORAL_CAPSULE | Freq: Two times a day (BID) | ORAL | Status: DC
  Administered 2016-12-21 – 2016-12-24 (×7): 200 mg via ORAL
  Filled 2016-12-21 (×7): qty 2

## 2016-12-21 MED ORDER — POLYETHYLENE GLYCOL 3350 17 G PO PACK
17.0000 g | PACK | Freq: Every day | ORAL | Status: DC
  Administered 2016-12-21 – 2016-12-23 (×3): 17 g via ORAL
  Filled 2016-12-21 (×4): qty 1

## 2016-12-21 NOTE — Consult Note (Signed)
[place] Psychiatry Consult   Reason for Consult:  Consult for 46 year old man currently in the hospital with pancreatitis. Question about alcohol abuse. Referring Physician:  Mody Patient Identification: Don Thompson MRN:  [id number] Principal Diagnosis: Alcohol abuse Diagnosis:   Patient Active Problem List   Diagnosis Date Noted  . Pancreatitis [K85.90] 12/19/2016  . Alcohol abuse [F10.10] 12/19/2016    Total Time spent with patient: 15 minutes  Subjective:   Don Thompson is a 46 y.o. male patient admitted with "my stomach hurts".  Follow-up today with this 46 year old man with a history of substance abuse and some mental health problems. Spoke with the patient and his wife today. Patient says he is feeling much better although still having abdominal pain. Emotionally he says he is feeling pretty stable. Denies any serious depression or suicidality. Not feeling hopeless. I reviewed with him some of his behavior and mood problems that he had had in the past. Patient says he is not currently seeing anybody for outpatient treatment. He had been prescribed some trazodone for sleep but doesn't find it particularly helpful. He is not currently psychotic not having DTs.  Follow-up Monday the seventh. Patient seen. He reports that his pain is getting better. Was able to tolerate fluid today. Mood is stated as being okay. Affect euthymic. Behavior seems appropriate and pleasant.  HPI:  Patient interviewed. Chart reviewed including old notes from outside hospitals. Patient presented to the emergency room with acute abdominal pain. On interview today the patient says his stomach started hurting badly yesterday. Last drink was probably a couple days ago. Patient says he is a heavy drinker. Declines to speculate about how much she drinks on a typical day. Patient currently states that he has no other complaints other than his abdominal pain. Not having any hallucinations. Mood is  feeling better since getting into the hospital. Denies suicidal or homicidal thoughts.  Social history: Patient is currently employed. Seems to have pretty extensive family support judging by the number of people present in the room this afternoon.  Medical history: This appears to be his first history episode of pancreatitis. Long-standing problems with alcohol abuse.  Substance abuse history: Long-standing alcohol problems going back years. Had an admission to [facility] 2 years ago for alcohol abuse. At that time also was having a lot of mood and behavior problems related to alcohol. Patient denies any history of withdrawal seizures. Denies any history of delirium tremens. Has never really participated in outpatient substance abuse treatment. Also has a history of heavy marijuana use.  Past Psychiatric History: Patient has a history of suicidal threats and agitated behavior probably related with blacking out. Judging from the notes from 2016 it sounds like the problems were thought to be specifically related to heavy drinking. Patient denies any current suicidal intent or plan. Not currently getting any outpatient mental health treatment.  Risk to Self: Is patient at risk for suicide?: No Risk to Others:   Prior Inpatient Therapy:   Prior Outpatient Therapy:    Past Medical History:  Past Medical History:  Diagnosis Date  . GERD (gastroesophageal reflux disease)    History reviewed. No pertinent surgical history. Family History: History reviewed. No pertinent family history. Family Psychiatric  History: Unknown Social History:  History  Alcohol Use  . Yes     History  Drug use: Unknown    Social History   Social History  . Marital status: Single    Spouse name: N/A  . Number of  children: N/A  . Years of education: N/A   Social History Main Topics  . Smoking status: Current Every Day Smoker  . Smokeless tobacco: Never Used  . Alcohol use Yes  . Drug use: Unknown  . Sexual  activity: Not Asked   Other Topics Concern  . None   Social History Narrative  . None   Additional Social History:    Allergies:  No Known Allergies  Labs:  Results for orders placed or performed during the hospital encounter of 12/19/16 (from the past 48 hour(s))  HIV antibody     Status: None   Collection Time: 12/20/16  6:01 AM  Result Value Ref Range   HIV Screen 4th Generation wRfx Non Reactive Non Reactive    Comment: (NOTE) Performed At: [facility] [place], [place] [id number] Lindon Romp MD [id number]   Basic metabolic panel     Status: Abnormal   Collection Time: 12/20/16  6:01 AM  Result Value Ref Range   Sodium 132 (L) 135 - 145 mmol/L   Potassium 3.6 3.5 - 5.1 mmol/L   Chloride 101 101 - 111 mmol/L   CO2 22 22 - 32 mmol/L   Glucose, Bld 91 65 - 99 mg/dL   BUN 7 6 - 20 mg/dL   Creatinine, Ser 0.69 0.61 - 1.24 mg/dL   Calcium 8.2 (L) 8.9 - 10.3 mg/dL   GFR calc non Af Amer >60 >60 mL/min   GFR calc Af Amer >60 >60 mL/min    Comment: (NOTE) The eGFR has been calculated using the CKD EPI equation. This calculation has not been validated in all clinical situations. eGFR's persistently <60 mL/min signify possible Chronic Kidney Disease.    Anion gap 9 5 - 15  CBC     Status: Abnormal   Collection Time: 12/20/16  6:01 AM  Result Value Ref Range   WBC 17.7 (H) 3.8 - 10.6 K/uL   RBC 4.79 4.40 - 5.90 MIL/uL   Hemoglobin 15.1 13.0 - 18.0 g/dL   HCT 45.6 40.0 - 52.0 %   MCV 95.1 80.0 - 100.0 fL   MCH 31.6 26.0 - 34.0 pg   MCHC 33.2 32.0 - 36.0 g/dL   RDW 13.3 11.5 - 14.5 %   Platelets 140 (L) 150 - 440 K/uL  Magnesium     Status: None   Collection Time: 12/20/16  6:01 AM  Result Value Ref Range   Magnesium 1.7 1.7 - 2.4 mg/dL  Basic metabolic panel     Status: Abnormal   Collection Time: 12/21/16  5:23 AM  Result Value Ref Range   Sodium 133 (L) 135 - 145 mmol/L   Potassium 3.5 3.5 - 5.1 mmol/L   Chloride 104 101 - 111  mmol/L   CO2 21 (L) 22 - 32 mmol/L   Glucose, Bld 85 65 - 99 mg/dL   BUN 9 6 - 20 mg/dL   Creatinine, Ser 0.73 0.61 - 1.24 mg/dL   Calcium 8.3 (L) 8.9 - 10.3 mg/dL   GFR calc non Af Amer >60 >60 mL/min   GFR calc Af Amer >60 >60 mL/min    Comment: (NOTE) The eGFR has been calculated using the CKD EPI equation. This calculation has not been validated in all clinical situations. eGFR's persistently <60 mL/min signify possible Chronic Kidney Disease.    Anion gap 8 5 - 15  CBC     Status: Abnormal   Collection Time: 12/21/16  5:23 AM  Result Value  Ref Range   WBC 15.4 (H) 3.8 - 10.6 K/uL   RBC 3.94 (L) 4.40 - 5.90 MIL/uL   Hemoglobin 13.2 13.0 - 18.0 g/dL   HCT 37.6 (L) 40.0 - 52.0 %   MCV 95.4 80.0 - 100.0 fL   MCH 33.4 26.0 - 34.0 pg   MCHC 35.0 32.0 - 36.0 g/dL   RDW 13.2 11.5 - 14.5 %   Platelets 134 (L) 150 - 440 K/uL  Magnesium     Status: None   Collection Time: 12/21/16  5:23 AM  Result Value Ref Range   Magnesium 1.9 1.7 - 2.4 mg/dL  Lipase, blood     Status: None   Collection Time: 12/21/16  5:23 AM  Result Value Ref Range   Lipase 38 11 - 51 U/L    Current Facility-Administered Medications  Medication Dose Route Frequency Provider Last Rate Last Dose  . 0.9 %  sodium chloride infusion   Intravenous Continuous Bettey Costa, MD 175 mL/hr at 12/21/16 1520    . acetaminophen (TYLENOL) tablet 650 mg  650 mg Oral Q6H PRN Bettey Costa, MD   650 mg at 12/21/16 0105   Or  . acetaminophen (TYLENOL) suppository 650 mg  650 mg Rectal Q6H PRN Mody, Sital, MD      . bisacodyl (DULCOLAX) EC tablet 5 mg  5 mg Oral Daily PRN Mody, Sital, MD      . docusate sodium (COLACE) capsule 200 mg  200 mg Oral BID Bettey Costa, MD   200 mg at 12/21/16 1037  . enoxaparin (LOVENOX) injection 40 mg  40 mg Subcutaneous Q24H Mody, Sital, MD      . fentaNYL (SUBLIMAZE) injection 50 mcg  50 mcg Intravenous Q30 min PRN Loney Hering, MD   Stopped at 12/19/16 1052  . folic acid (FOLVITE) tablet 1  mg  1 mg Oral Daily Mody, Sital, MD   1 mg at 12/21/16 1037  . hydrALAZINE (APRESOLINE) injection 10 mg  10 mg Intravenous Q6H PRN Mody, Sital, MD      . HYDROmorphone (DILAUDID) injection 1 mg  1 mg Intravenous Q4H PRN Bettey Costa, MD   1 mg at 12/21/16 1314  . LORazepam (ATIVAN) injection 0-4 mg  0-4 mg Intravenous Q12H Mody, Sital, MD      . LORazepam (ATIVAN) tablet 1 mg  1 mg Oral Q6H PRN Bettey Costa, MD       Or  . LORazepam (ATIVAN) injection 1 mg  1 mg Intravenous Q6H PRN Bettey Costa, MD   1 mg at 12/21/16 1218  . metoprolol (LOPRESSOR) injection 5 mg  5 mg Intravenous Q4H PRN Bettey Costa, MD   5 mg at 12/21/16 1443  . multivitamin with minerals tablet 1 tablet  1 tablet Oral Daily Bettey Costa, MD   1 tablet at 12/21/16 1037  . ondansetron (ZOFRAN) tablet 4 mg  4 mg Oral Q6H PRN Mody, Sital, MD       Or  . ondansetron (ZOFRAN) injection 4 mg  4 mg Intravenous Q6H PRN Mody, Sital, MD      . polyethylene glycol (MIRALAX / GLYCOLAX) packet 17 g  17 g Oral Daily Mody, Sital, MD   17 g at 12/21/16 1415  . senna (SENOKOT) tablet 8.6 mg  1 tablet Oral Daily Mody, Sital, MD   8.6 mg at 12/21/16 1037  . senna-docusate (Senokot-S) tablet 1 tablet  1 tablet Oral QHS PRN Bettey Costa, MD      . thiamine (VITAMIN  B-1) tablet 100 mg  100 mg Oral Daily Mody, Sital, MD   100 mg at 12/21/16 1037   Or  . thiamine (B-1) injection 100 mg  100 mg Intravenous Daily Bettey Costa, MD        Musculoskeletal: Strength & Muscle Tone: decreased Gait & Station: unable to stand Patient leans: N/A  Psychiatric Specialty Exam: Physical Exam  Nursing note and vitals reviewed. Constitutional: He appears well-developed and well-nourished.  HENT:  Head: Normocephalic and atraumatic.  Eyes: Conjunctivae are normal. Pupils are equal, round, and reactive to light.  Neck: Normal range of motion.  Cardiovascular: Regular rhythm and normal heart sounds.   Respiratory: Effort normal. No respiratory distress.  GI:  Soft. There is tenderness.  Musculoskeletal: Normal range of motion.  Neurological: He is alert.  Skin: Skin is warm and dry.  Psychiatric: He has a normal mood and affect. His speech is normal and behavior is normal. Judgment and thought content normal. Cognition and memory are normal.    Review of Systems  Constitutional: Negative.   HENT: Negative.   Eyes: Negative.   Respiratory: Negative.   Cardiovascular: Negative.   Gastrointestinal: Negative.   Musculoskeletal: Negative.   Skin: Negative.   Neurological: Negative.   Psychiatric/Behavioral: Positive for substance abuse. Negative for depression, hallucinations, memory loss and suicidal ideas. The patient is nervous/anxious and has insomnia.     Blood pressure (!) 149/82, pulse 86, temperature 100.3 F (37.9 C), temperature source Oral, resp. rate 20, height 5' 5"  (1.651 m), weight 95.3 kg (210 lb), SpO2 99 %.Body mass index is 34.95 kg/m.  General Appearance: Casual  Eye Contact:  Good  Speech:  Clear and Coherent  Volume:  Normal  Mood:  Euthymic  Affect:  Constricted  Thought Process:  Goal Directed  Orientation:  Full (Time, Place, and Person)  Thought Content:  Logical  Suicidal Thoughts:  No  Homicidal Thoughts:  No  Memory:  Immediate;   Fair Recent;   Fair Remote;   Fair  Judgement:  Fair  Insight:  Fair  Psychomotor Activity:  Normal  Concentration:  Concentration: Fair  Recall:  [employer] of Knowledge:  Fair  Language:  Fair  Akathisia:  No  Handed:  Right  AIMS (if indicated):     Assets:  Desire for Improvement Resilience Social Support  ADL's:  Intact  Cognition:  WNL  Sleep:        Treatment Plan Summary: Daily contact with patient to assess and evaluate symptoms and progress in treatment, Medication management and Plan No change to treatment plan. Encouragement and supportive counseling briefly done. Patient of course will benefit from outpatient mental health referral for substance abuse  treatment. I will continue to follow-up regularly in the hospital.  Disposition: Patient does not meet criteria for psychiatric inpatient admission. Supportive therapy provided about ongoing stressors.  Alethia Berthold, MD 12/21/2016 3:58 PM

## 2016-12-21 NOTE — Progress Notes (Signed)
Okay to change NPO order to NPO and sips with meds per Dr. Juliene PinaMody.

## 2016-12-21 NOTE — Progress Notes (Signed)
[employer] - [place] at [facility]   PATIENT NAME: Don Thompson    MR#:  [id number]  DATE OF BIRTH:  1971/01/12  SUBJECTIVE:   Receiving pain medications every 4 hours    REVIEW OF SYSTEMS:    Review of Systems  Constitutional: Negative for fever, chills weight loss HENT: Negative for ear pain, nosebleeds, congestion, facial swelling, rhinorrhea, neck pain, neck stiffness and ear discharge.   Respiratory: Negative for cough, shortness of breath, wheezing  Cardiovascular: Negative for chest pain, palpitations and leg swelling.  Gastrointestinal: Negative for heartburn, ++abdominal pain, NO vomiting, diarrhea or consitpation Genitourinary: Negative for dysuria, urgency, frequency, hematuria Musculoskeletal: Negative for back pain or joint pain Neurological: Negative for dizziness, seizures, syncope, focal weakness,  numbness and headaches.  Some tremors Hematological: Does not bruise/bleed easily.  Psychiatric/Behavioral: Negative for hallucinations, confusion, dysphoric mood    Tolerating Diet: NPO      DRUG ALLERGIES:  No Known Allergies  VITALS:  Blood pressure (!) 145/68, pulse (!) 46, temperature 98.9 F (37.2 C), temperature source Oral, resp. rate 20, height 5\' 5"  (1.651 m), weight 95.3 kg (210 lb), SpO2 97 %.  PHYSICAL EXAMINATION:  Constitutional: Appears well-developed and well-nourished. No distress. HENT: Normocephalic. Marland Kitchen. Oropharynx is clear and moist.  Eyes: Conjunctivae and EOM are normal. PERRLA, no scleral icterus.  Neck: Normal ROM. Neck supple. No JVD. No tracheal deviation. CVS: RRR, S1/S2 +, no murmurs, no gallops, no carotid bruit.  Pulmonary: Effort and breath sounds normal, no stridor, rhonchi, wheezes, rales.  Abdominal: Soft. BS +,  no distension, mild generalized tenderness, NO rebound or guarding.  Musculoskeletal: Normal range of motion. No edema and no tenderness.  Neuro: Alert. CN 2-12 grossly intact. No focal  deficits. Skin: Skin is warm and dry. No rash noted. Psychiatric: Normal mood and affect.  No asterixis, tremors present this am    LABORATORY PANEL:   CBC  Recent Labs Lab 12/21/16 0523  WBC 15.4*  HGB 13.2  HCT 37.6*  PLT 134*   ------------------------------------------------------------------------------------------------------------------  Chemistries   Recent Labs Lab 12/19/16 0228  12/21/16 0523  NA 134*  < > 133*  K 3.5  < > 3.5  CL 101  < > 104  CO2 24  < > 21*  GLUCOSE 130*  < > 85  BUN 8  < > 9  CREATININE 0.97  < > 0.73  CALCIUM 9.2  < > 8.3*  MG  --   < > 1.9  AST 36  --   --   ALT 47  --   --   ALKPHOS 48  --   --   BILITOT 1.0  --   --   < > = values in this interval not displayed. ------------------------------------------------------------------------------------------------------------------  Cardiac Enzymes No results for input(s): TROPONINI in the last 168 hours. ------------------------------------------------------------------------------------------------------------------  RADIOLOGY:  No results found.   ASSESSMENT AND PLAN:    46 year old male with EtOH and tobacco dependence who presents with midepigastric abdominal pain and elevated lipase consistent with pancreatitis.  1. Acute EtOH related pancreatitis with negative right upper quad ultrasound for gallstone pancreatitis, still requiring quite a large amount of pain medications Continue supportive care antiemetics, pain meds Continue IV fluids  NPO until relatively pain free Will order KUB and laxatives due to receiving pain medications frequently.   2. EtOH abuse:Continue CIWA protocol He would benefit from outpatient substance abuse at discharge. Psychiatry consult appreciated 3 Tobacco dependence: Patient is encouraged to quit smoking.  4 Hyperglycemia:Hemoglobin A1c 5.6  5. Mild hyponatremia from nausea and vomiting and pancreatitis Continue IV fluids     6. Elevated WBC from pancreatitis/dehydration: Improving.    Management plans discussed with the patient and he is in agreement.  CODE STATUS: full  TOTAL TIME TAKING CARE OF THIS PATIENT: 25 minutes.     POSSIBLE D/C 3-4 days, DEPENDING ON CLINICAL CONDITION.   Hillel Card M.D on 12/21/2016 at 7:10 AM  Between 7am to 6pm - Pager - [phone] After 6pm go to [email or web address] - password [email or web address]  [facility] [place] [facility]  Office  [phone]  CC: Primary care physician; Patient, No Pcp Per  Note: This dictation was prepared with Dragon dictation along with smaller phrase technology. Any transcriptional errors that result from this process are unintentional.

## 2016-12-21 NOTE — Care Management (Signed)
RNCM placed due to ETOH abuse.  CSW notified.  RNCM signing off.  Please re consult if indicated.

## 2016-12-22 ENCOUNTER — Inpatient Hospital Stay: Payer: BLUE CROSS/BLUE SHIELD

## 2016-12-22 ENCOUNTER — Encounter: Payer: Self-pay | Admitting: Radiology

## 2016-12-22 LAB — CBC
HCT: 37.6 % — ABNORMAL LOW (ref 40.0–52.0)
Hemoglobin: 13 g/dL (ref 13.0–18.0)
MCH: 33.4 pg (ref 26.0–34.0)
MCHC: 34.5 g/dL (ref 32.0–36.0)
MCV: 96.7 fL (ref 80.0–100.0)
PLATELETS: 160 10*3/uL (ref 150–440)
RBC: 3.89 MIL/uL — ABNORMAL LOW (ref 4.40–5.90)
RDW: 13 % (ref 11.5–14.5)
WBC: 11.2 10*3/uL — ABNORMAL HIGH (ref 3.8–10.6)

## 2016-12-22 LAB — LIPASE, BLOOD: Lipase: 38 U/L (ref 11–51)

## 2016-12-22 LAB — MAGNESIUM: Magnesium: 2.1 mg/dL (ref 1.7–2.4)

## 2016-12-22 MED ORDER — IOPAMIDOL (ISOVUE-300) INJECTION 61%
100.0000 mL | Freq: Once | INTRAVENOUS | Status: AC | PRN
  Administered 2016-12-22: 100 mL via INTRAVENOUS

## 2016-12-22 MED ORDER — OXYCODONE-ACETAMINOPHEN 5-325 MG PO TABS
1.0000 | ORAL_TABLET | Freq: Three times a day (TID) | ORAL | Status: DC | PRN
  Administered 2016-12-22 – 2016-12-24 (×5): 1 via ORAL
  Filled 2016-12-22 (×6): qty 1

## 2016-12-22 MED ORDER — IOPAMIDOL (ISOVUE-300) INJECTION 61%
15.0000 mL | INTRAVENOUS | Status: AC
  Administered 2016-12-22 (×2): 15 mL via ORAL

## 2016-12-22 NOTE — Progress Notes (Signed)
[facility] [facility] [place] at [facility]   PATIENT NAME: Don Thompson    MR#:  [id number]  DATE OF BIRTH:  11-09-1970  SUBJECTIVE:   Pain was better but now worse after eating a hamburger Does not want to go home    REVIEW OF SYSTEMS:    Review of Systems  Constitutional: Negative for fever, chills weight loss HENT: Negative for ear pain, nosebleeds, congestion, facial swelling, rhinorrhea, neck pain, neck stiffness and ear discharge.   Respiratory: Negative for cough, shortness of breath, wheezing  Cardiovascular: Negative for chest pain, palpitations and leg swelling.  Gastrointestinal: Negative for heartburn, ++abdominal pain, NO vomiting, diarrhea or consitpation Genitourinary: Negative for dysuria, urgency, frequency, hematuria Musculoskeletal: Negative for back pain or joint pain Neurological: Negative for dizziness, seizures, syncope, focal weakness,  numbness and headaches.  Some tremors Hematological: Does not bruise/bleed easily.  Psychiatric/Behavioral: Negative for hallucinations, confusion, dysphoric mood    Tolerating Diet: clears      DRUG ALLERGIES:  No Known Allergies  VITALS:  Blood pressure (!) 150/87, pulse 81, temperature 98.1 F (36.7 C), temperature source Oral, resp. rate 17, height 5\' 5"  (1.651 m), weight 95.3 kg (210 lb), SpO2 100 %.  PHYSICAL EXAMINATION:  Constitutional: Appears well-developed and well-nourished. No distress. HENT: Normocephalic. Marland Kitchen. Oropharynx is clear and moist.  Eyes: Conjunctivae and EOM are normal. PERRLA, no scleral icterus.  Neck: Normal ROM. Neck supple. No JVD. No tracheal deviation. CVS: RRR, S1/S2 +, no murmurs, no gallops, no carotid bruit.  Pulmonary: Effort and breath sounds normal, no stridor, rhonchi, wheezes, rales.  Abdominal: Soft. BS +,  no distension, no tenderness, NO rebound or guarding.  Musculoskeletal: Normal range of motion. No edema and no tenderness.  Neuro: Alert. CN 2-12  grossly intact. No focal deficits. Skin: Skin is warm and dry. No rash noted. Psychiatric: Normal mood and affect.  No asterixis, tremors present this am    LABORATORY PANEL:   CBC  Recent Labs Lab 12/22/16 0515  WBC 11.2*  HGB 13.0  HCT 37.6*  PLT 160   ------------------------------------------------------------------------------------------------------------------  Chemistries   Recent Labs Lab 12/19/16 0228  12/21/16 0523 12/22/16 0515  NA 134*  < > 133*  --   K 3.5  < > 3.5  --   CL 101  < > 104  --   CO2 24  < > 21*  --   GLUCOSE 130*  < > 85  --   BUN 8  < > 9  --   CREATININE 0.97  < > 0.73  --   CALCIUM 9.2  < > 8.3*  --   MG  --   < > 1.9 2.1  AST 36  --   --   --   ALT 47  --   --   --   ALKPHOS 48  --   --   --   BILITOT 1.0  --   --   --   < > = values in this interval not displayed. ------------------------------------------------------------------------------------------------------------------  Cardiac Enzymes No results for input(s): TROPONINI in the last 168 hours. ------------------------------------------------------------------------------------------------------------------  RADIOLOGY:  Dg Abd 1 View  Result Date: 12/21/2016 CLINICAL DATA:  Abdominal pain and constipation EXAM: ABDOMEN - 1 VIEW COMPARISON:  CT abdomen pelvis October 28, 2012 FINDINGS: There is moderate stool throughout the colon. There is no bowel dilatation or air-fluid level to suggest bowel obstruction. No free air. There are phleboliths in the pelvis peer IMPRESSION: No bowel obstruction  or free air.  Moderate stool in colon. Electronically Signed   By: Bretta Bang III M.D.   On: 12/21/2016 08:22     ASSESSMENT AND PLAN:    46 year old male with EtOH and tobacco dependence who presents with midepigastric abdominal pain and elevated lipase consistent with pancreatitis.  1. Acute EtOH related pancreatitis with negative right upper quad ultrasound for gallstone  pancreatitis,  Will order CT ABD to eval on going abd pian with nml lipase and several BM now.  2. EtOH abuse:No s/s of withdrawal He would benefit from outpatient substance abuse at discharge. Psychiatry consult appreciated  3 Tobacco dependence: Patient is encouraged to quit smoking.   4 Hyperglycemia:Hemoglobin A1c 5.6  5. Mild hyponatremia from nausea and vomiting and pancreatitis Continue IV fluids    6. Elevated WBC from pancreatitis/dehydration: Improving.    Management plans discussed with the patient and he is in agreement.  CODE STATUS: full  TOTAL TIME TAKING CARE OF THIS PATIENT: 25 minutes.     POSSIBLE D/C tomorrow, DEPENDING ON CLINICAL CONDITION.   Don Thompson M.D on 12/22/2016 at 5:58 PM  Between 7am to 6pm - Pager - [phone] After 6pm go to [email or web address] - password [email or web address]  [facility]  Office  [phone]  CC: Primary care physician; Patient, No Pcp Per  Note: This dictation was prepared with Dragon dictation along with smaller phrase technology. Any transcriptional errors that result from this process are unintentional.

## 2016-12-22 NOTE — Consult Note (Signed)
[facility] [facility] Consult   Reason for Consult:  Consult for 46 year old man currently in the hospital with pancreatitis. Question about alcohol abuse. Referring Physician:  Mody Patient Identification: Don Thompson MRN:  [id number] Principal Diagnosis: Alcohol abuse Diagnosis:   Patient Active Problem List   Diagnosis Date Noted  . Pancreatitis [K85.90] 12/19/2016  . Alcohol abuse [F10.10] 12/19/2016    Total Time spent with patient: 15 minutes  Subjective:   Don Thompson is a 46 y.o. male patient admitted with "my stomach hurts".  Follow-up today with this 46 year old man with a history of substance abuse and some mental health problems. Spoke with the patient and his wife today. Patient says he is feeling much better although still having abdominal pain. Emotionally he says he is feeling pretty stable. Denies any serious depression or suicidality. Not feeling hopeless. I reviewed with him some of his behavior and mood problems that he had had in the past. Patient says he is not currently seeing anybody for outpatient treatment. He had been prescribed some trazodone for sleep but doesn't find it particularly helpful. He is not currently psychotic not having DTs.  Follow-up Monday the seventh. Patient seen. He reports that his pain is getting better. Was able to tolerate fluid today. Mood is stated as being okay. Affect euthymic. Behavior seems appropriate and pleasant.  Follow-up Tuesday the eighth. Patient says he is feeling significantly better and is looking forward to going home today. Affect euthymic. Denies suicidal or homicidal thought. Patient states that he intends to make an effort to stop drinking.  HPI:  Patient interviewed. Chart reviewed including old notes from outside hospitals. Patient presented to the emergency room with acute abdominal pain. On interview today the patient says his stomach started hurting badly yesterday. Last drink was probably a  couple days ago. Patient says he is a heavy drinker. Declines to speculate about how much she drinks on a typical day. Patient currently states that he has no other complaints other than his abdominal pain. Not having any hallucinations. Mood is feeling better since getting into the hospital. Denies suicidal or homicidal thoughts.  Social history: Patient is currently employed. Seems to have pretty extensive family support judging by the number of people present in the room this afternoon.  Medical history: This appears to be his first history episode of pancreatitis. Long-standing problems with alcohol abuse.  Substance abuse history: Long-standing alcohol problems going back years. Had an admission to [facility] 2 years ago for alcohol abuse. At that time also was having a lot of mood and behavior problems related to alcohol. Patient denies any history of withdrawal seizures. Denies any history of delirium tremens. Has never really participated in outpatient substance abuse treatment. Also has a history of heavy marijuana use.  Past Psychiatric History: Patient has a history of suicidal threats and agitated behavior probably related with blacking out. Judging from the notes from 2016 it sounds like the problems were thought to be specifically related to heavy drinking. Patient denies any current suicidal intent or plan. Not currently getting any outpatient mental health treatment.  Risk to Self: Is patient at risk for suicide?: No Risk to Others:   Prior Inpatient Therapy:   Prior Outpatient Therapy:    Past Medical History:  Past Medical History:  Diagnosis Date  . GERD (gastroesophageal reflux disease)    History reviewed. No pertinent surgical history. Family History: History reviewed. No pertinent family history. Family Psychiatric  History: Unknown Social History:  History  Alcohol Use  . Yes     History  Drug use: Unknown    Social History   Social History  . Marital status: Single     Spouse name: N/A  . Number of children: N/A  . Years of education: N/A   Social History Main Topics  . Smoking status: Current Every Day Smoker  . Smokeless tobacco: Never Used  . Alcohol use Yes  . Drug use: Unknown  . Sexual activity: Not Asked   Other Topics Concern  . None   Social History Narrative  . None   Additional Social History:    Allergies:  No Known Allergies  Labs:  Results for orders placed or performed during the hospital encounter of 12/19/16 (from the past 48 hour(s))  Basic metabolic panel     Status: Abnormal   Collection Time: 12/21/16  5:23 AM  Result Value Ref Range   Sodium 133 (L) 135 - 145 mmol/L   Potassium 3.5 3.5 - 5.1 mmol/L   Chloride 104 101 - 111 mmol/L   CO2 21 (L) 22 - 32 mmol/L   Glucose, Bld 85 65 - 99 mg/dL   BUN 9 6 - 20 mg/dL   Creatinine, Ser 0.73 0.61 - 1.24 mg/dL   Calcium 8.3 (L) 8.9 - 10.3 mg/dL   GFR calc non Af Amer >60 >60 mL/min   GFR calc Af Amer >60 >60 mL/min    Comment: (NOTE) The eGFR has been calculated using the CKD EPI equation. This calculation has not been validated in all clinical situations. eGFR's persistently <60 mL/min signify possible Chronic Kidney Disease.    Anion gap 8 5 - 15  CBC     Status: Abnormal   Collection Time: 12/21/16  5:23 AM  Result Value Ref Range   WBC 15.4 (H) 3.8 - 10.6 K/uL   RBC 3.94 (L) 4.40 - 5.90 MIL/uL   Hemoglobin 13.2 13.0 - 18.0 g/dL   HCT 37.6 (L) 40.0 - 52.0 %   MCV 95.4 80.0 - 100.0 fL   MCH 33.4 26.0 - 34.0 pg   MCHC 35.0 32.0 - 36.0 g/dL   RDW 13.2 11.5 - 14.5 %   Platelets 134 (L) 150 - 440 K/uL  Magnesium     Status: None   Collection Time: 12/21/16  5:23 AM  Result Value Ref Range   Magnesium 1.9 1.7 - 2.4 mg/dL  Lipase, blood     Status: None   Collection Time: 12/21/16  5:23 AM  Result Value Ref Range   Lipase 38 11 - 51 U/L  Magnesium     Status: None   Collection Time: 12/22/16  5:15 AM  Result Value Ref Range   Magnesium 2.1 1.7 - 2.4  mg/dL  Lipase, blood     Status: None   Collection Time: 12/22/16  5:15 AM  Result Value Ref Range   Lipase 38 11 - 51 U/L  CBC     Status: Abnormal   Collection Time: 12/22/16  5:15 AM  Result Value Ref Range   WBC 11.2 (H) 3.8 - 10.6 K/uL   RBC 3.89 (L) 4.40 - 5.90 MIL/uL   Hemoglobin 13.0 13.0 - 18.0 g/dL   HCT 37.6 (L) 40.0 - 52.0 %   MCV 96.7 80.0 - 100.0 fL   MCH 33.4 26.0 - 34.0 pg   MCHC 34.5 32.0 - 36.0 g/dL   RDW 13.0 11.5 - 14.5 %   Platelets 160 150 - 440 K/uL  Current Facility-Administered Medications  Medication Dose Route Frequency Provider Last Rate Last Dose  . 0.9 %  sodium chloride infusion   Intravenous Continuous Bettey Costa, MD 175 mL/hr at 12/22/16 0941    . acetaminophen (TYLENOL) tablet 650 mg  650 mg Oral Q6H PRN Bettey Costa, MD   650 mg at 12/21/16 0105   Or  . acetaminophen (TYLENOL) suppository 650 mg  650 mg Rectal Q6H PRN Mody, Sital, MD      . bisacodyl (DULCOLAX) EC tablet 5 mg  5 mg Oral Daily PRN Bettey Costa, MD   5 mg at 12/22/16 0942  . docusate sodium (COLACE) capsule 200 mg  200 mg Oral BID Bettey Costa, MD   200 mg at 12/22/16 0942  . enoxaparin (LOVENOX) injection 40 mg  40 mg Subcutaneous Q24H Mody, Sital, MD      . folic acid (FOLVITE) tablet 1 mg  1 mg Oral Daily Bettey Costa, MD   1 mg at 12/22/16 0942  . hydrALAZINE (APRESOLINE) injection 10 mg  10 mg Intravenous Q6H PRN Mody, Sital, MD      . HYDROmorphone (DILAUDID) injection 1 mg  1 mg Intravenous Q4H PRN Bettey Costa, MD   1 mg at 12/22/16 1442  . LORazepam (ATIVAN) injection 0-4 mg  0-4 mg Intravenous Q12H Bettey Costa, MD   1 mg at 12/22/16 0625  . metoprolol (LOPRESSOR) injection 5 mg  5 mg Intravenous Q4H PRN Bettey Costa, MD   5 mg at 12/22/16 0544  . multivitamin with minerals tablet 1 tablet  1 tablet Oral Daily Bettey Costa, MD   1 tablet at 12/22/16 0942  . ondansetron (ZOFRAN) tablet 4 mg  4 mg Oral Q6H PRN Mody, Sital, MD       Or  . ondansetron (ZOFRAN) injection 4 mg  4 mg  Intravenous Q6H PRN Mody, Sital, MD      . oxyCODONE-acetaminophen (PERCOCET/ROXICET) 5-325 MG per tablet 1 tablet  1 tablet Oral Q8H PRN Mody, Sital, MD      . polyethylene glycol (MIRALAX / GLYCOLAX) packet 17 g  17 g Oral Daily Bettey Costa, MD   17 g at 12/22/16 0942  . senna (SENOKOT) tablet 8.6 mg  1 tablet Oral Daily Bettey Costa, MD   8.6 mg at 12/22/16 0942  . senna-docusate (Senokot-S) tablet 1 tablet  1 tablet Oral QHS PRN Bettey Costa, MD   1 tablet at 12/21/16 2107  . thiamine (VITAMIN B-1) tablet 100 mg  100 mg Oral Daily Bettey Costa, MD   100 mg at 12/22/16 8372   Or  . thiamine (B-1) injection 100 mg  100 mg Intravenous Daily Bettey Costa, MD        Musculoskeletal: Strength & Muscle Tone: decreased Gait & Station: unable to stand Patient leans: N/A  Psychiatric Specialty Exam: Physical Exam  Nursing note and vitals reviewed. Constitutional: He appears well-developed and well-nourished.  HENT:  Head: Normocephalic and atraumatic.  Eyes: Conjunctivae are normal. Pupils are equal, round, and reactive to light.  Neck: Normal range of motion.  Cardiovascular: Regular rhythm and normal heart sounds.   Respiratory: Effort normal. No respiratory distress.  GI: Soft. There is tenderness.  Musculoskeletal: Normal range of motion.  Neurological: He is alert.  Skin: Skin is warm and dry.  Psychiatric: He has a normal mood and affect. His speech is normal and behavior is normal. Judgment and thought content normal. Cognition and memory are normal.    Review of Systems  Constitutional:  Negative.   HENT: Negative.   Eyes: Negative.   Respiratory: Negative.   Cardiovascular: Negative.   Gastrointestinal: Negative.   Musculoskeletal: Negative.   Skin: Negative.   Neurological: Negative.   Psychiatric/Behavioral: Positive for substance abuse. Negative for depression, hallucinations, memory loss and suicidal ideas. The patient is nervous/anxious and has insomnia.     Blood  pressure (!) 150/87, pulse 81, temperature 98.1 F (36.7 C), temperature source Oral, resp. rate 17, height 5' 5"  (1.651 m), weight 95.3 kg (210 lb), SpO2 100 %.Body mass index is 34.95 kg/m.  General Appearance: Casual  Eye Contact:  Good  Speech:  Clear and Coherent  Volume:  Normal  Mood:  Euthymic  Affect:  Constricted  Thought Process:  Goal Directed  Orientation:  Full (Time, Place, and Person)  Thought Content:  Logical  Suicidal Thoughts:  No  Homicidal Thoughts:  No  Memory:  Immediate;   Fair Recent;   Fair Remote;   Fair  Judgement:  Fair  Insight:  Fair  Psychomotor Activity:  Normal  Concentration:  Concentration: Fair  Recall:  [employer] of Knowledge:  Fair  Language:  Fair  Akathisia:  No  Handed:  Right  AIMS (if indicated):     Assets:  Desire for Improvement Resilience Social Support  ADL's:  Intact  Cognition:  WNL  Sleep:        Treatment Plan Summary: Daily contact with patient to assess and evaluate symptoms and progress in treatment, Medication management and Plan No indication for psychiatric inpatient treatment. Encouraged patient to make the best use of resources to get back into sobriety. No need for medication.  Disposition: Patient does not meet criteria for psychiatric inpatient admission. Supportive therapy provided about ongoing stressors.  Alethia Berthold, MD 12/22/2016 5:42 PM

## 2016-12-22 NOTE — Progress Notes (Signed)
Chaplain rounding unit visited with Pt. Pt's significant other was at the bedside. Pt stated he was doing much better today. Pt [facility] to provide follow up visit with Pt as needed.    12/22/16 1600  Clinical Encounter Type  Visited With Patient;Family  Visit Type Initial;Spiritual support  Referral From Nurse  Consult/Referral To Chaplain  Spiritual Encounters  Spiritual Needs Prayer

## 2016-12-23 LAB — MAGNESIUM: MAGNESIUM: 2.2 mg/dL (ref 1.7–2.4)

## 2016-12-23 NOTE — Progress Notes (Signed)
[facility] [place] [facility]   PATIENT NAME: Don Thompson    MR#:  [id number]  DATE OF BIRTH:  May 21, 1971  SUBJECTIVE:   Pain waxes and wanes. Patient had vomiting after drinking contrast for CT scan yesterday Wants clear liquid diet  REVIEW OF SYSTEMS:    Review of Systems  Constitutional: Negative for fever, chills weight loss HENT: Negative for ear pain, nosebleeds, congestion, facial swelling, rhinorrhea, neck pain, neck stiffness and ear discharge.   Respiratory: Negative for cough, shortness of breath, wheezing  Cardiovascular: Negative for chest pain, palpitations and leg swelling.  Gastrointestinal: Negative for heartburn, ++abdominal pain, NO vomiting, diarrhea or consitpation Genitourinary: Negative for dysuria, urgency, frequency, hematuria Musculoskeletal: Negative for back pain or joint pain Neurological: Negative for dizziness, seizures, syncope, focal weakness,  numbness and headaches.  Some tremors Hematological: Does not bruise/bleed easily.  Psychiatric/Behavioral: Negative for hallucinations, confusion, dysphoric mood    Tolerating Diet: clears      DRUG ALLERGIES:  No Known Allergies  VITALS:  Blood pressure (!) 158/83, pulse 70, temperature 99.6 F (37.6 C), temperature source Oral, resp. rate 18, height 5\' 5"  (1.651 m), weight 95.3 kg (210 lb), SpO2 98 %.  PHYSICAL EXAMINATION:  Constitutional: Appears well-developed and well-nourished. No distress. HENT: Normocephalic. Marland Kitchen Oropharynx is clear and moist.  Eyes: Conjunctivae and EOM are normal. PERRLA, no scleral icterus.  Neck: Normal ROM. Neck supple. No JVD. No tracheal deviation. CVS: RRR, S1/S2 +, no murmurs, no gallops, no carotid bruit.  Pulmonary: Effort and breath sounds normal, no stridor, rhonchi, wheezes, rales.  Abdominal: Soft. BS +,  no distension, +some generalized tenderness, NO rebound or guarding.  Musculoskeletal: Normal range of motion. No edema and  no tenderness.  Neuro: Alert. CN 2-12 grossly intact. No focal deficits. Skin: Skin is warm and dry. No rash noted. Psychiatric: Normal mood and affect.  No asterixis, tremors present    LABORATORY PANEL:   CBC  Recent Labs Lab 12/22/16 0515  WBC 11.2*  HGB 13.0  HCT 37.6*  PLT 160   ------------------------------------------------------------------------------------------------------------------  Chemistries   Recent Labs Lab 12/19/16 0228  12/21/16 0523  12/23/16 0433  NA 134*  < > 133*  --   --   K 3.5  < > 3.5  --   --   CL 101  < > 104  --   --   CO2 24  < > 21*  --   --   GLUCOSE 130*  < > 85  --   --   BUN 8  < > 9  --   --   CREATININE 0.97  < > 0.73  --   --   CALCIUM 9.2  < > 8.3*  --   --   MG  --   < > 1.9  < > 2.2  AST 36  --   --   --   --   ALT 47  --   --   --   --   ALKPHOS 48  --   --   --   --   BILITOT 1.0  --   --   --   --   < > = values in this interval not displayed. ------------------------------------------------------------------------------------------------------------------  Cardiac Enzymes No results for input(s): TROPONINI in the last 168 hours. ------------------------------------------------------------------------------------------------------------------  RADIOLOGY:  Dg Abd 1 View  Result Date: 12/21/2016 CLINICAL DATA:  Abdominal pain and constipation EXAM: ABDOMEN - 1 VIEW COMPARISON:  CT  abdomen pelvis October 28, 2012 FINDINGS: There is moderate stool throughout the colon. There is no bowel dilatation or air-fluid level to suggest bowel obstruction. No free air. There are phleboliths in the pelvis peer IMPRESSION: No bowel obstruction or free air.  Moderate stool in colon. Electronically Signed   By: Bretta Bang III M.D.   On: 12/21/2016 08:22   Ct Abdomen Pelvis W Contrast  Result Date: 12/22/2016 CLINICAL DATA:  Acute onset of epigastric abdominal pain and elevated lipase. Nausea and vomiting. Initial encounter. EXAM:  CT ABDOMEN AND PELVIS WITH CONTRAST TECHNIQUE: Multidetector CT imaging of the abdomen and pelvis was performed using the standard protocol following bolus administration of intravenous contrast. CONTRAST:  ISOVUE-300 IOPAMIDOL (ISOVUE-300) INJECTION 61% COMPARISON:  CT of the abdomen and pelvis performed 10/28/2012, and right upper quadrant ultrasound performed 12/19/2016 FINDINGS: Lower chest: The visualized lung bases are grossly clear. The visualized portions of the mediastinum are unremarkable. Hepatobiliary: There is diffuse fatty infiltration within the liver, with sparing about the gallbladder fossa. The gallbladder is grossly unremarkable in appearance. The common bile duct remains normal in caliber. Pancreas: There is diffuse soft tissue inflammation about the entirety of the pancreas, compatible with acute pancreatitis. There is no definite evidence of devascularization or pseudocyst formation at this time. Vague soft tissue inflammation tracks about the mesentery and adjacent duodenum. Spleen: The spleen is unremarkable in appearance. Adrenals/Urinary Tract: The adrenal glands are unremarkable in appearance. Mild nonspecific perinephric stranding is noted bilaterally. The kidneys are otherwise unremarkable. There is no evidence of hydronephrosis. No renal or ureteral stones are identified. Stomach/Bowel: The stomach is unremarkable in appearance. The small bowel is within normal limits. The appendix is normal in caliber, without evidence of appendicitis. The colon is unremarkable in appearance, aside from a single diverticulum along the proximal sigmoid colon. The bowel is difficult to fully assess due to motion artifact. Vascular/Lymphatic: The abdominal aorta is unremarkable in appearance, though difficult to fully assess due to motion artifact. The inferior vena cava is grossly unremarkable. No retroperitoneal lymphadenopathy is seen. No pelvic sidewall lymphadenopathy is identified.  Reproductive: The bladder is mildly distended and grossly unremarkable. The prostate remains normal in size. Other: No additional soft tissue abnormalities are seen. A small right inguinal hernia is noted, containing only fat. Musculoskeletal: No acute osseous abnormalities are identified. The visualized musculature is unremarkable in appearance. IMPRESSION: 1. Diffuse soft tissue inflammation about the entirety of the pancreas, compatible with acute pancreatitis. No evidence of devascularization or pseudocyst formation at this time. Vague soft tissue inflammation tracks about the mesentery and adjacent duodenum. 2. Small right inguinal hernia, containing only fat. 3. Diffuse fatty infiltration within the liver. Electronically Signed   By: Roanna Raider M.D.   On: 12/22/2016 20:53     ASSESSMENT AND PLAN:    46 year old male with EtOH and tobacco dependence who presents with midepigastric abdominal pain and elevated lipase consistent with pancreatitis.  1. Acute EtOH related pancreatitis with negative right upper quad ultrasound for gallstone pancreatitis,  CT of the abdomen consistent with acute pancreatitis Continue IV fluids, antiemetics and pain control Patient is tolerating clear liquid diet.  2. EtOH abuse:No s/s of withdrawal He would benefit from outpatient substance abuse at discharge. Psychiatry consult appreciated  3 Tobacco dependence: Patient has been encouraged to quit smoking.   4 Hyperglycemia:Hemoglobin A1c 5.6  5. Mild hyponatremia from nausea and vomiting and pancreatitis Continue IV fluids    6. Elevated WBC from pancreatitis/dehydration: Improving.  Management plans discussed with the patient and he is in agreement.  CODE STATUS: full  TOTAL TIME TAKING CARE OF THIS PATIENT: 24 minutes.     POSSIBLE D/C 1-2 days, DEPENDING ON CLINICAL CONDITION.   Sayvon Arterberry M.D on 12/23/2016 at 7:22 AM  Between 7am to 6pm - Pager - [phone] After 6pm go to  [email or web address] - password [email or web address]  [facility]  Office  [phone]  CC: Primary care physician; Patient, No Pcp Per  Note: This dictation was prepared with Dragon dictation along with smaller phrase technology. Any transcriptional errors that result from this process are unintentional.

## 2016-12-23 NOTE — Progress Notes (Signed)
Per Dr. Juliene PinaMody okay to change pt to soft diet as he is requesting some mashed potatoes

## 2016-12-23 NOTE — Progress Notes (Signed)
CH made a follow up visit with Pt. Pt's significant other, son and daughter-in-law, and granddaughter were in the Rm. Pt stated he was doing much better today than yesterday. Pt introduced CH to his son and requested prayers for healing. CH provided prayers and compassionate presence.     12/23/16 1600  Clinical Encounter Type  Visited With Patient;Patient and family together  Visit Type Follow-up;Spiritual support  Referral From Chaplain  Spiritual Encounters  Spiritual Needs Prayer

## 2016-12-24 LAB — BASIC METABOLIC PANEL
Anion gap: 4 — ABNORMAL LOW (ref 5–15)
BUN: <5 mg/dL — ABNORMAL LOW (ref 6–20)
CALCIUM: 8.2 mg/dL — AB (ref 8.9–10.3)
CO2: 26 mmol/L (ref 22–32)
CREATININE: 0.59 mg/dL — AB (ref 0.61–1.24)
Chloride: 109 mmol/L (ref 101–111)
GFR calc non Af Amer: >60 mL/min (ref >60)
Glucose, Bld: 106 mg/dL — ABNORMAL HIGH (ref 65–99)
Potassium: 3.1 mmol/L — ABNORMAL LOW (ref 3.5–5.1)
Sodium: 139 mmol/L (ref 135–145)

## 2016-12-24 LAB — CBC
HCT: 37.7 % — ABNORMAL LOW (ref 40.0–52.0)
Hemoglobin: 13.3 g/dL (ref 13.0–18.0)
MCH: 33 pg (ref 26.0–34.0)
MCHC: 35.2 g/dL (ref 32.0–36.0)
MCV: 93.8 fL (ref 80.0–100.0)
PLATELETS: 228 10*3/uL (ref 150–440)
RBC: 4.02 MIL/uL — ABNORMAL LOW (ref 4.40–5.90)
RDW: 13.2 % (ref 11.5–14.5)
WBC: 9.8 10*3/uL (ref 3.8–10.6)

## 2016-12-24 LAB — MAGNESIUM: MAGNESIUM: 2 mg/dL (ref 1.7–2.4)

## 2016-12-24 MED ORDER — POTASSIUM CHLORIDE CRYS ER 20 MEQ PO TBCR
40.0000 meq | EXTENDED_RELEASE_TABLET | Freq: Once | ORAL | Status: AC
  Administered 2016-12-24: 40 meq via ORAL
  Filled 2016-12-24: qty 2

## 2016-12-24 MED ORDER — AMLODIPINE BESYLATE 5 MG PO TABS: 5.0000 mg | ORAL_TABLET | Freq: Every day | ORAL | 0 refills | Status: DC

## 2016-12-24 MED ORDER — OXYCODONE-ACETAMINOPHEN 5-325 MG PO TABS
1.0000 | ORAL_TABLET | Freq: Once | ORAL | Status: AC
  Administered 2016-12-24: 1 via ORAL
  Filled 2016-12-24: qty 1

## 2016-12-24 NOTE — Discharge Summary (Signed)
[facility] - [place] at [facility]   PATIENT NAME: Don Thompson    MR#:  [id number]  DATE OF BIRTH:  1971/06/12  DATE OF ADMISSION:  12/19/2016 ADMITTING PHYSICIAN: Adrian SaranSital Mattingly Fountaine, MD  DATE OF DISCHARGE: 12/24/2016  PRIMARY CARE PHYSICIAN: McLean-Scocuzza, Pasty Spillersracy N, MD    ADMISSION DIAGNOSIS:  Abdominal pain [R10.9] Alcohol-induced acute pancreatitis, unspecified complication status [K85.20]  DISCHARGE DIAGNOSIS:  Principal Problem:   Alcohol abuse Active Problems:   Pancreatitis   SECONDARY DIAGNOSIS:   Past Medical History:  Diagnosis Date  . GERD (gastroesophageal reflux disease)     HOSPITAL COURSE:   46 year old male with EtOH and tobacco dependence who presents with midepigastric abdominal pain and elevated lipase consistent with pancreatitis.  1. Acute EtOH related pancreatitis with negative right upper quad ultrasound for gallstone pancreatitis,  CT of the abdomen consistent with acute pancreatitis Patient was treated for acute pancreatitis with IV fluids, pain medications and antiemetics. Patient is tolerating soft diet. It should be noted that patient's partner was asking for pain medications. I told him that I was not going to prescribe any narcotic pain medications as the patient has been eating without nausea vomiting or abdominal pain.   2. EtOH abuse: Patient had uneventful detox. He would benefit from outpatient substance abuse at discharge. Psychiatry consult was appreciated  3 Tobacco dependence: Patient has been encouraged to quit smoking.   4 Hyperglycemia:Hemoglobin A1c 5.6  5. Mild hyponatremia this improved with IV fluids    6. Elevated WBC from pancreatitis/dehydration: Improving.  7. Elevated blood pressure no formal diagnosis of hypertension: Patient was started on Norvasc 5 mg daily. He will need follow-up with PCP regarding his blood pressure. Patient's blood pressure were consistently systolic greater than  140.   DISCHARGE CONDITIONS AND DIET:   Stable for discharge on regular diet    CONSULTS OBTAINED:  Treatment Team:  Clapacs, Jackquline DenmarkJohn T, MD  DRUG ALLERGIES:  No Known Allergies  DISCHARGE MEDICATIONS:   Current Discharge Medication List    START taking these medications   Details  amLODipine (NORVASC) 5 MG tablet Take 1 tablet (5 mg total) by mouth daily. Qty: 30 tablet, Refills: 0      CONTINUE these medications which have NOT CHANGED   Details  famotidine (PEPCID) 20 MG tablet Take 20 mg by mouth daily.    omeprazole (PRILOSEC) 20 MG capsule Take 20 mg by mouth daily.          Today   CHIEF COMPLAINT:   Patient tolerated soft diet yesterday and today   VITAL SIGNS:  Blood pressure (!) 160/94, pulse (!) 59, temperature 98.6 F (37 C), temperature source Oral, resp. rate 20, height 5\' 5"  (1.651 m), weight 95.3 kg (210 lb), SpO2 100 %.   REVIEW OF SYSTEMS:  Review of Systems  Constitutional: Negative.  Negative for chills, fever and malaise/fatigue.  HENT: Negative.  Negative for ear discharge, ear pain, hearing loss, nosebleeds and sore throat.   Eyes: Negative.  Negative for blurred vision and pain.  Respiratory: Negative.  Negative for cough, hemoptysis, shortness of breath and wheezing.   Cardiovascular: Negative.  Negative for chest pain, palpitations and leg swelling.  Gastrointestinal: Negative.  Negative for abdominal pain, blood in stool, diarrhea, nausea and vomiting.  Genitourinary: Negative.  Negative for dysuria.  Musculoskeletal: Negative.  Negative for back pain.  Skin: Negative.   Neurological: Negative for dizziness, tremors, speech change, focal weakness, seizures, weakness and headaches.  Endo/Heme/Allergies: Negative.  Does not  bruise/bleed easily.  Psychiatric/Behavioral: Negative.  Negative for depression, hallucinations and suicidal ideas.     PHYSICAL EXAMINATION:  GENERAL:  46 y.o.-year-old patient lying in the bed with no acute  distress.  NECK:  Supple, no jugular venous distention. No thyroid enlargement, no tenderness.  LUNGS: Normal breath sounds bilaterally, no wheezing, rales,rhonchi  No use of accessory muscles of respiration.  CARDIOVASCULAR: S1, S2 normal. No murmurs, rubs, or gallops.  ABDOMEN: Soft, non-tender, non-distended. Bowel sounds present. No organomegaly or mass.  EXTREMITIES: No pedal edema, cyanosis, or clubbing.  PSYCHIATRIC: The patient is alert and oriented x 3.  SKIN: No obvious rash, lesion, or ulcer.   DATA REVIEW:   CBC  Recent Labs Lab 12/24/16 0448  WBC 9.8  HGB 13.3  HCT 37.7*  PLT 228    Chemistries   Recent Labs Lab 12/19/16 0228  12/24/16 0448  NA 134*  < > 139  K 3.5  < > 3.1*  CL 101  < > 109  CO2 24  < > 26  GLUCOSE 130*  < > 106*  BUN 8  < > <5*  CREATININE 0.97  < > 0.59*  CALCIUM 9.2  < > 8.2*  MG  --   < > 2.0  AST 36  --   --   ALT 47  --   --   ALKPHOS 48  --   --   BILITOT 1.0  --   --   < > = values in this interval not displayed.  Cardiac Enzymes No results for input(s): TROPONINI in the last 168 hours.  Microbiology Results  @[email or web address] @  RADIOLOGY:  Ct Abdomen Pelvis W Contrast  Result Date: 12/22/2016 CLINICAL DATA:  Acute onset of epigastric abdominal pain and elevated lipase. Nausea and vomiting. Initial encounter. EXAM: CT ABDOMEN AND PELVIS WITH CONTRAST TECHNIQUE: Multidetector CT imaging of the abdomen and pelvis was performed using the standard protocol following bolus administration of intravenous contrast. CONTRAST:  ISOVUE-300 IOPAMIDOL (ISOVUE-300) INJECTION 61% COMPARISON:  CT of the abdomen and pelvis performed 10/28/2012, and right upper quadrant ultrasound performed 12/19/2016 FINDINGS: Lower chest: The visualized lung bases are grossly clear. The visualized portions of the mediastinum are unremarkable. Hepatobiliary: There is diffuse fatty infiltration within the liver, with sparing about the gallbladder fossa. The  gallbladder is grossly unremarkable in appearance. The common bile duct remains normal in caliber. Pancreas: There is diffuse soft tissue inflammation about the entirety of the pancreas, compatible with acute pancreatitis. There is no definite evidence of devascularization or pseudocyst formation at this time. Vague soft tissue inflammation tracks about the mesentery and adjacent duodenum. Spleen: The spleen is unremarkable in appearance. Adrenals/Urinary Tract: The adrenal glands are unremarkable in appearance. Mild nonspecific perinephric stranding is noted bilaterally. The kidneys are otherwise unremarkable. There is no evidence of hydronephrosis. No renal or ureteral stones are identified. Stomach/Bowel: The stomach is unremarkable in appearance. The small bowel is within normal limits. The appendix is normal in caliber, without evidence of appendicitis. The colon is unremarkable in appearance, aside from a single diverticulum along the proximal sigmoid colon. The bowel is difficult to fully assess due to motion artifact. Vascular/Lymphatic: The abdominal aorta is unremarkable in appearance, though difficult to fully assess due to motion artifact. The inferior vena cava is grossly unremarkable. No retroperitoneal lymphadenopathy is seen. No pelvic sidewall lymphadenopathy is identified. Reproductive: The bladder is mildly distended and grossly unremarkable. The prostate remains normal in size. Other: No additional soft tissue abnormalities  are seen. A small right inguinal hernia is noted, containing only fat. Musculoskeletal: No acute osseous abnormalities are identified. The visualized musculature is unremarkable in appearance. IMPRESSION: 1. Diffuse soft tissue inflammation about the entirety of the pancreas, compatible with acute pancreatitis. No evidence of devascularization or pseudocyst formation at this time. Vague soft tissue inflammation tracks about the mesentery and adjacent duodenum. 2. Small right  inguinal hernia, containing only fat. 3. Diffuse fatty infiltration within the liver. Electronically Signed   By: Roanna Raider M.D.   On: 12/22/2016 20:53      Current Discharge Medication List    START taking these medications   Details  amLODipine (NORVASC) 5 MG tablet Take 1 tablet (5 mg total) by mouth daily. Qty: 30 tablet, Refills: 0      CONTINUE these medications which have NOT CHANGED   Details  famotidine (PEPCID) 20 MG tablet Take 20 mg by mouth daily.    omeprazole (PRILOSEC) 20 MG capsule Take 20 mg by mouth daily.           Management plans discussed with the patient and he is in agreement. Stable for discharge home  Patient should follow up with pcp  CODE STATUS:     Code Status Orders        Start     Ordered   12/19/16 0808  Full code  Continuous     12/19/16 0808    Code Status History    Date Active Date Inactive Code Status Order ID Comments User Context   This patient has a current code status but no historical code status.      TOTAL TIME TAKING CARE OF THIS PATIENT: 37 minutes.    Note: This dictation was prepared with Dragon dictation along with smaller phrase technology. Any transcriptional errors that result from this process are unintentional.  Ronit Marczak M.D on 12/24/2016 at 10:00 AM  Between 7am to 6pm - Pager - [phone] After 6pm go to [email or web address] - Social research officer, government  [facility]  Office  [phone]  CC: Primary care physician; McLean-Scocuzza, Pasty Spillers, MD

## 2016-12-24 NOTE — Progress Notes (Signed)
Pt A and O x 4. VSS. Pt tolerating diet well. Pain with meds given to control. No noausea. IV removed intact, prescriptions given. Pt voiced understanding of discharge instructions with no further questions. Pt given a pain pill prior to discharge, per MD. Pt discharged via wheelchair with axillary.

## 2016-12-24 NOTE — Progress Notes (Addendum)
Okay per Dr. Juliene PinaMody to place order for 1 percocet 5/325 now and pt will be okay to discharge after. It is not time for pain medication yet according to prn order. Pt was upset that he was not recieving a prescription for pain medicine at discharge. Pt's fiance called patient relations to make a complaint. Nursing Administrator spoke with pt and MD. Pt to receive one dose of oral pain meds and then he has agreed to go home.

## 2016-12-24 NOTE — Progress Notes (Signed)
[facility] [place] at [facility]   PATIENT NAME: Don Thompson    MR#:  [id number]  DATE OF BIRTH:  [birth year]  SUBJECTIVE:   Tolerated soft diet no n/v/abd pain Got pain med this am  before eating  REVIEW OF SYSTEMS:    Review of Systems  Constitutional: Negative for fever, chills weight loss HENT: Negative for ear pain, nosebleeds, congestion, facial swelling, rhinorrhea, neck pain, neck stiffness and ear discharge.   Respiratory: Negative for cough, shortness of breath, wheezing  Cardiovascular: Negative for chest pain, palpitations and leg swelling.  Gastrointestinal: Negative for heartburn, ++abdominal pain, NO vomiting, diarrhea or consitpation Genitourinary: Negative for dysuria, urgency, frequency, hematuria Musculoskeletal: Negative for back pain or joint pain Neurological: Negative for dizziness, seizures, syncope, focal weakness,  numbness and headaches.  Some tremors Hematological: Does not bruise/bleed easily.  Psychiatric/Behavioral: Negative for hallucinations, confusion, dysphoric mood    Tolerating Diet: soft      DRUG ALLERGIES:  No Known Allergies  VITALS:  Blood pressure (!) 160/94, pulse (!) 59, temperature 98.6 F (37 C), temperature source Oral, resp. rate 20, height 5\' 5"  (1.651 m), weight 95.3 kg (210 lb), SpO2 100 %.  PHYSICAL EXAMINATION:  Constitutional: Appears well-developed and well-nourished. No distress. HENT: Normocephalic. Marland Kitchen. Oropharynx is clear and moist.  Eyes: Conjunctivae and EOM are normal. PERRLA, no scleral icterus.  Neck: Normal ROM. Neck supple. No JVD. No tracheal deviation. CVS: RRR, S1/S2 +, no murmurs, no gallops, no carotid bruit.  Pulmonary: Effort and breath sounds normal, no stridor, rhonchi, wheezes, rales.  Abdominal: Soft. BS +,  no distension, +some generalized tenderness, NO rebound or guarding.  Musculoskeletal: Normal range of motion. No edema and no tenderness.  Neuro: Alert. CN 2-12  grossly intact. No focal deficits. Skin: Skin is warm and dry. No rash noted. Psychiatric: Normal mood and affect.  No asterixis, tremors present    LABORATORY PANEL:   CBC  Recent Labs Lab 12/24/16 0448  WBC 9.8  HGB 13.3  HCT 37.7*  PLT 228   ------------------------------------------------------------------------------------------------------------------  Chemistries   Recent Labs Lab 12/19/16 0228  12/24/16 0448  NA 134*  < > 139  K 3.5  < > 3.1*  CL 101  < > 109  CO2 24  < > 26  GLUCOSE 130*  < > 106*  BUN 8  < > <5*  CREATININE 0.97  < > 0.59*  CALCIUM 9.2  < > 8.2*  MG  --   < > 2.0  AST 36  --   --   ALT 47  --   --   ALKPHOS 48  --   --   BILITOT 1.0  --   --   < > = values in this interval not displayed. ------------------------------------------------------------------------------------------------------------------  Cardiac Enzymes No results for input(s): TROPONINI in the last 168 hours. ------------------------------------------------------------------------------------------------------------------  RADIOLOGY:  Ct Abdomen Pelvis W Contrast  Result Date: 12/22/2016 CLINICAL DATA:  Acute onset of epigastric abdominal pain and elevated lipase. Nausea and vomiting. Initial encounter. EXAM: CT ABDOMEN AND PELVIS WITH CONTRAST TECHNIQUE: Multidetector CT imaging of the abdomen and pelvis was performed using the standard protocol following bolus administration of intravenous contrast. CONTRAST:  100mL ISOVUE-300 IOPAMIDOL (ISOVUE-300) INJECTION 61% COMPARISON:  CT of the abdomen and pelvis performed 10/28/2012, and right upper quadrant ultrasound performed 12/19/2016 FINDINGS: Lower chest: The visualized lung bases are grossly clear. The visualized portions of the mediastinum are unremarkable. Hepatobiliary: There is diffuse fatty infiltration within the  liver, with sparing about the gallbladder fossa. The gallbladder is grossly unremarkable in appearance. The  common bile duct remains normal in caliber. Pancreas: There is diffuse soft tissue inflammation about the entirety of the pancreas, compatible with acute pancreatitis. There is no definite evidence of devascularization or pseudocyst formation at this time. Vague soft tissue inflammation tracks about the mesentery and adjacent duodenum. Spleen: The spleen is unremarkable in appearance. Adrenals/Urinary Tract: The adrenal glands are unremarkable in appearance. Mild nonspecific perinephric stranding is noted bilaterally. The kidneys are otherwise unremarkable. There is no evidence of hydronephrosis. No renal or ureteral stones are identified. Stomach/Bowel: The stomach is unremarkable in appearance. The small bowel is within normal limits. The appendix is normal in caliber, without evidence of appendicitis. The colon is unremarkable in appearance, aside from a single diverticulum along the proximal sigmoid colon. The bowel is difficult to fully assess due to motion artifact. Vascular/Lymphatic: The abdominal aorta is unremarkable in appearance, though difficult to fully assess due to motion artifact. The inferior vena cava is grossly unremarkable. No retroperitoneal lymphadenopathy is seen. No pelvic sidewall lymphadenopathy is identified. Reproductive: The bladder is mildly distended and grossly unremarkable. The prostate remains normal in size. Other: No additional soft tissue abnormalities are seen. A small right inguinal hernia is noted, containing only fat. Musculoskeletal: No acute osseous abnormalities are identified. The visualized musculature is unremarkable in appearance. IMPRESSION: 1. Diffuse soft tissue inflammation about the entirety of the pancreas, compatible with acute pancreatitis. No evidence of devascularization or pseudocyst formation at this time. Vague soft tissue inflammation tracks about the mesentery and adjacent duodenum. 2. Small right inguinal hernia, containing only fat. 3. Diffuse fatty  infiltration within the liver. Electronically Signed   By: Roanna Raider M.D.   On: 12/22/2016 20:53     ASSESSMENT AND PLAN:    46 year old male with EtOH and tobacco dependence who presents with midepigastric abdominal pain and elevated lipase consistent with pancreatitis.  1. Acute EtOH related pancreatitis with negative right upper quad ultrasound for gallstone pancreatitis,  CT of the abdomen consistent with acute pancreatitis Patient was treated for acute pancreatitis with IV fluids, pain medications and antiemetics. Patient is tolerating soft diet. It should be noted that patient's partner was asking for pain medications. I told him that I was not going to prescribe any narcotic pain medications as the patient has been eating without nausea vomiting or abdominal pain.   2. EtOH abuse: Patient had uneventful detox. He would benefit from outpatient substance abuse at discharge. Psychiatry consult was appreciated  3 Tobacco dependence: Patient has beenencouraged to quit smoking.   4 Hyperglycemia:Hemoglobin A1c 5.6  5. Mild hyponatremia this improved with IV fluids    6. Elevated WBC from pancreatitis/dehydration: Improving.  7. Elevated blood pressure no formal diagnosis of hypertension: Patient was started on Norvasc 5 mg daily. He will need follow-up with PCP regarding his blood pressure. Patient's blood pressure were consistently systolic greater than 140.  8. Hypokalemia: This was repleted   Management plans discussed with the patient and he is in agreement.  CODE STATUS: full  TOTAL TIME TAKING CARE OF THIS PATIENT: 24 minutes.     POSSIBLE D/C  today DEPENDING ON CLINICAL CONDITION.   Shuntia Exton M.D on 12/24/2016 at 10:03 AM  Between 7am to 6pm - Pager - [phone] After 6pm go to [email or web address] - password [email or web address]  [facility] [place]  Office  [phone]  CC: Primary care physician; Patient, No Pcp Per  Note: This  dictation was  prepared with Dragon dictation along with smaller phrase technology. Any transcriptional errors that result from this process are unintentional.

## 2016-12-24 NOTE — Progress Notes (Signed)
Called by Sherian MaroonAbbie RN for patient concern. Patient dressed up in chair. Male sitting on the bed identified herself as his fiance. She stated she had concerns about him going home with out narcotic pain medication. I explained the new laws are very strict about prescribing narcotics for patients who have not undergone surgery, do not have  have cancer or chronic pain. Spoke with Dr. Juliene PinaMody who did not feel that the patient needed narcotics at home. She said she would keep him, restart IV fluids, keep him NPO and give him meds. I offered him this choice or he could go home. He asked if he could have a pain pill before he left. Abbie RN was calling Dr. Juliene PinaMody to check on getting a one time order for his pain med. Told him that if he went home and could not control his pain he could come back to the ED.

## 2017-05-15 ENCOUNTER — Inpatient Hospital Stay
Admission: EM | Admit: 2017-05-15 | Discharge: 2017-05-16 | DRG: 440 | Disposition: A | Discharge status: Home / Self Care | Payer: BLUE CROSS/BLUE SHIELD | Attending: Internal Medicine | Admitting: Internal Medicine

## 2017-05-15 ENCOUNTER — Emergency Department: Payer: BLUE CROSS/BLUE SHIELD

## 2017-05-15 ENCOUNTER — Encounter: Payer: Self-pay | Admitting: *Deleted

## 2017-05-15 DIAGNOSIS — E860 Dehydration: Secondary | ICD-10-CM | POA: Diagnosis present

## 2017-05-15 DIAGNOSIS — F1010 Alcohol abuse, uncomplicated: Secondary | ICD-10-CM | POA: Diagnosis present

## 2017-05-15 DIAGNOSIS — K859 Acute pancreatitis without necrosis or infection, unspecified: Secondary | ICD-10-CM

## 2017-05-15 DIAGNOSIS — K861 Other chronic pancreatitis: Secondary | ICD-10-CM | POA: Diagnosis present

## 2017-05-15 DIAGNOSIS — Z79899 Other long term (current) drug therapy: Secondary | ICD-10-CM | POA: Diagnosis not present

## 2017-05-15 DIAGNOSIS — F17210 Nicotine dependence, cigarettes, uncomplicated: Secondary | ICD-10-CM | POA: Diagnosis present

## 2017-05-15 DIAGNOSIS — K852 Alcohol induced acute pancreatitis without necrosis or infection: Secondary | ICD-10-CM | POA: Diagnosis not present

## 2017-05-15 DIAGNOSIS — K219 Gastro-esophageal reflux disease without esophagitis: Secondary | ICD-10-CM | POA: Diagnosis present

## 2017-05-15 HISTORY — DX: Alcohol induced acute pancreatitis without necrosis or infection: K85.20

## 2017-05-15 LAB — COMPREHENSIVE METABOLIC PANEL
ALT: 26 U/L (ref 17–63)
AST: 30 U/L (ref 15–41)
Albumin: 4.5 g/dL (ref 3.5–5.0)
Alkaline Phosphatase: 40 U/L (ref 38–126)
Anion gap: 12 (ref 5–15)
BUN: 15 mg/dL (ref 6–20)
CHLORIDE: 102 mmol/L (ref 101–111)
CO2: 23 mmol/L (ref 22–32)
Calcium: 9.5 mg/dL (ref 8.9–10.3)
Creatinine, Ser: 1.09 mg/dL (ref 0.61–1.24)
GFR calc non Af Amer: >60 mL/min (ref >60)
Glucose, Bld: 145 mg/dL — ABNORMAL HIGH (ref 65–99)
POTASSIUM: 3.9 mmol/L (ref 3.5–5.1)
SODIUM: 137 mmol/L (ref 135–145)
Total Bilirubin: 1.1 mg/dL (ref 0.3–1.2)
Total Protein: 7.9 g/dL (ref 6.5–8.1)

## 2017-05-15 LAB — CBC
HEMATOCRIT: 47 % (ref 40.0–52.0)
Hemoglobin: 16.4 g/dL (ref 13.0–18.0)
MCH: 31 pg (ref 26.0–34.0)
MCHC: 35 g/dL (ref 32.0–36.0)
MCV: 88.6 fL (ref 80.0–100.0)
Platelets: 254 10*3/uL (ref 150–440)
RBC: 5.3 MIL/uL (ref 4.40–5.90)
RDW: 13.2 % (ref 11.5–14.5)
WBC: 13.1 10*3/uL — AB (ref 3.8–10.6)

## 2017-05-15 LAB — URINALYSIS, COMPLETE (UACMP) WITH MICROSCOPIC
BILIRUBIN URINE: NEGATIVE
Bacteria, UA: NONE SEEN
Glucose, UA: >=500 mg/dL — AB
Hgb urine dipstick: NEGATIVE
KETONES UR: 20 mg/dL — AB
LEUKOCYTES UA: NEGATIVE
Nitrite: NEGATIVE
PROTEIN: 100 mg/dL — AB
pH: 5 (ref 5.0–8.0)

## 2017-05-15 LAB — TROPONIN I

## 2017-05-15 LAB — LIPASE, BLOOD: Lipase: 1533 U/L — ABNORMAL HIGH (ref 11–51)

## 2017-05-15 MED ORDER — ONDANSETRON HCL 4 MG/2ML IJ SOLN
4.0000 mg | Freq: Four times a day (QID) | INTRAMUSCULAR | Status: DC | PRN
Start: 2017-05-15 — End: 2017-05-16

## 2017-05-15 MED ORDER — ACETAMINOPHEN 650 MG RE SUPP: 650.0000 mg | Freq: Four times a day (QID) | RECTAL | Status: DC | PRN

## 2017-05-15 MED ORDER — LORAZEPAM 2 MG/ML IJ SOLN: 1.0000 mg | Freq: Four times a day (QID) | INTRAMUSCULAR | Status: DC | PRN

## 2017-05-15 MED ORDER — ENOXAPARIN SODIUM 40 MG/0.4ML ~~LOC~~ SOLN
40.0000 mg | SUBCUTANEOUS | Status: DC
  Filled 2017-05-15 (×2): qty 0.4

## 2017-05-15 MED ORDER — OXYCODONE-ACETAMINOPHEN 5-325 MG PO TABS
1.0000 | ORAL_TABLET | Freq: Four times a day (QID) | ORAL | Status: DC | PRN
  Administered 2017-05-15 – 2017-05-16 (×5): 1 via ORAL
  Filled 2017-05-15 (×5): qty 1

## 2017-05-15 MED ORDER — ONDANSETRON HCL 4 MG PO TABS: 4.0000 mg | ORAL_TABLET | Freq: Four times a day (QID) | ORAL | Status: DC | PRN

## 2017-05-15 MED ORDER — ONDANSETRON HCL 4 MG/2ML IJ SOLN
4.0000 mg | Freq: Once | INTRAMUSCULAR | Status: AC | PRN
  Administered 2017-05-15: 4 mg via INTRAVENOUS
  Filled 2017-05-15: qty 2

## 2017-05-15 MED ORDER — HYDROMORPHONE HCL 1 MG/ML IJ SOLN
1.0000 mg | Freq: Once | INTRAMUSCULAR | Status: AC
  Administered 2017-05-15: 1 mg via INTRAVENOUS
  Filled 2017-05-15: qty 1

## 2017-05-15 MED ORDER — SODIUM CHLORIDE 0.9 % IV BOLUS (SEPSIS)
1000.0000 mL | Freq: Once | INTRAVENOUS | Status: AC
  Administered 2017-05-15: 1000 mL via INTRAVENOUS

## 2017-05-15 MED ORDER — ACETAMINOPHEN 325 MG PO TABS: 650.0000 mg | ORAL_TABLET | Freq: Four times a day (QID) | ORAL | Status: DC | PRN

## 2017-05-15 MED ORDER — PANTOPRAZOLE SODIUM 40 MG IV SOLR
40.0000 mg | INTRAVENOUS | Status: DC
  Administered 2017-05-15 – 2017-05-16 (×2): 40 mg via INTRAVENOUS
  Filled 2017-05-15 (×2): qty 40

## 2017-05-15 MED ORDER — LACTATED RINGERS IV SOLN
INTRAVENOUS | Status: DC
  Administered 2017-05-15 – 2017-05-16 (×4): via INTRAVENOUS

## 2017-05-15 MED ORDER — FENTANYL CITRATE (PF) 100 MCG/2ML IJ SOLN
50.0000 ug | INTRAMUSCULAR | Status: AC | PRN
  Administered 2017-05-15 (×2): 50 ug via INTRAVENOUS
  Filled 2017-05-15 (×2): qty 2

## 2017-05-15 MED ORDER — HYDROMORPHONE HCL 1 MG/ML IJ SOLN
1.0000 mg | INTRAMUSCULAR | Status: DC | PRN
  Administered 2017-05-15 – 2017-05-16 (×7): 1 mg via INTRAVENOUS
  Filled 2017-05-15 (×7): qty 1

## 2017-05-15 MED ORDER — NICOTINE 21 MG/24HR TD PT24: 21.0000 mg | MEDICATED_PATCH | Freq: Every day | TRANSDERMAL | Status: DC

## 2017-05-15 NOTE — H&P (Signed)
[facility] [place] at [facility]   PATIENT NAME: Don Thompson    MR#:  [id number]  DATE OF BIRTH:  06/28/1971  DATE OF ADMISSION:  05/15/2017  PRIMARY CARE PHYSICIAN: Patient, No Pcp Per   REQUESTING/REFERRING PHYSICIAN:   CHIEF COMPLAINT:   Chief Complaint  Patient presents with  . Abdominal Pain    HISTORY OF PRESENT ILLNESS: Don Thompson  is a 46 y.o. male with a known history of GERD, pancreatitis, alcohol abuse presented to the emergency room with abdominal pain for the last 2 days.the pain is located in the epigastrium. The pain is sharp in nature, 8 out of 10 on a scale of 1-10. Patient has been drinking alcohol for the last couple of days. Has some nausea but no vomiting.No complaints of chest pain, shortness of breath.No fever and chills. He was evaluated in the emergency room lipase level was elevated.Hospitalist service was consulted for further care.  PAST MEDICAL HISTORY:   Past Medical History:  Diagnosis Date  . GERD (gastroesophageal reflux disease)   . Pancreatitis, alcoholic, acute     PAST SURGICAL HISTORY: Past Surgical History:  Procedure Laterality Date  . none      SOCIAL HISTORY:  Social History  Substance Use Topics  . Smoking status: Current Every Day Smoker    Types: Cigarettes  . Smokeless tobacco: Never Used  . Alcohol use Yes    FAMILY HISTORY:  Family History  Problem Relation Age of Onset  . Multiple sclerosis Mother   . Alcoholism Father     DRUG ALLERGIES: No Known Allergies  REVIEW OF SYSTEMS:   CONSTITUTIONAL: No fever, fatigue or weakness.  EYES: No blurred or double vision.  EARS, NOSE, AND THROAT: No tinnitus or ear pain.  RESPIRATORY: No cough, shortness of breath, wheezing or hemoptysis.  CARDIOVASCULAR: No chest pain, orthopnea, edema.  GASTROINTESTINAL: Has nausea, no vomiting, diarrhea  has abdominal pain.  GENITOURINARY: No dysuria, hematuria.  ENDOCRINE: No polyuria, nocturia,   HEMATOLOGY: No anemia, easy bruising or bleeding SKIN: No rash or lesion. MUSCULOSKELETAL: No joint pain or arthritis.   NEUROLOGIC: No tingling, numbness, weakness.  PSYCHIATRY: No anxiety or depression.   MEDICATIONS AT HOME:  Prior to Admission medications   Medication Sig Start Date End Date Taking? Authorizing Provider  doxylamine, Sleep, (UNISOM) 25 MG tablet Take 25 mg by mouth at bedtime as needed.   Yes [provider]  amLODipine (NORVASC) 5 MG tablet Take 1 tablet (5 mg total) by mouth daily. Patient not taking: Reported on 05/15/2017 12/24/16   Adrian Saran, MD  famotidine (PEPCID) 20 MG tablet Take 20 mg by mouth daily.    [provider]  omeprazole (PRILOSEC) 20 MG capsule Take 20 mg by mouth daily.    [provider]      PHYSICAL EXAMINATION:   VITAL SIGNS: Blood pressure 127/69, pulse 70, temperature 98.2 F (36.8 C), temperature source Oral, resp. rate 18, height  (1.676 m), weight 92.1 kg (203 lb), SpO2 100 %.  GENERAL:  46 y.o.-year-old patient lying in the bed with no acute distress.  EYES: Pupils equal, round, reactive to light and accommodation. No scleral icterus. Extraocular muscles intact.  HEENT: Head atraumatic, normocephalic. Oropharynx dry and nasopharynx clear.  NECK:  Supple, no jugular venous distention. No thyroid enlargement, no tenderness.  LUNGS: Normal breath sounds bilaterally, no wheezing, rales,rhonchi or crepitation. No use of accessory muscles of respiration.  CARDIOVASCULAR: S1, S2 normal. No murmurs, rubs,  or gallops.  ABDOMEN: Soft, tenderness in epigastrium, nondistended. Bowel sounds present. No organomegaly or mass.  EXTREMITIES: No pedal edema, cyanosis, or clubbing.  NEUROLOGIC: Cranial nerves II through XII are intact. Muscle strength 5/5 in all extremities. Sensation intact. Gait not checked.  PSYCHIATRIC: The patient is alert and oriented x 3.  SKIN: No obvious rash, lesion, or ulcer.   LABORATORY  PANEL:   CBC  Recent Labs Lab 05/15/17 0353  WBC 13.1*  HGB 16.4  HCT 47.0  PLT 254  MCV 88.6  MCH 31.0  MCHC 35.0  RDW 13.2   ------------------------------------------------------------------------------------------------------------------  Chemistries   Recent Labs Lab 05/15/17 0353  NA 137  K 3.9  CL 102  CO2 23  GLUCOSE 145*  BUN 15  CREATININE 1.09  CALCIUM 9.5  AST 30  ALT 26  ALKPHOS 40  BILITOT 1.1   ------------------------------------------------------------------------------------------------------------------ estimated creatinine clearance is 90.9 mL/min (by C-G formula based on SCr of 1.09 mg/dL). ------------------------------------------------------------------------------------------------------------------ No results for input(s): TSH, T4TOTAL, T3FREE, THYROIDAB in the last 72 hours.  Invalid input(s): FREET3   Coagulation profile No results for input(s): INR, PROTIME in the last 168 hours. ------------------------------------------------------------------------------------------------------------------- No results for input(s): DDIMER in the last 72 hours. -------------------------------------------------------------------------------------------------------------------  Cardiac Enzymes  Recent Labs Lab 05/15/17 0353  TROPONINI <0.03   ------------------------------------------------------------------------------------------------------------------ Invalid input(s): POCBNP  ---------------------------------------------------------------------------------------------------------------  Urinalysis    Component Value Date/Time   COLORURINE YELLOW (A) 05/15/2017 0353   APPEARANCEUR CLEAR (A) 05/15/2017 0353   APPEARANCEUR Hazy 10/28/2012 1953   LABSPEC >1.046 (H) 05/15/2017 0353   LABSPEC 1.023 10/28/2012 1953   PHURINE 5.0 05/15/2017 0353   GLUCOSEU >=500 (A) 05/15/2017 0353   GLUCOSEU 50 mg/dL 19/14/7829 5621   HGBUR NEGATIVE  05/15/2017 0353   BILIRUBINUR NEGATIVE 05/15/2017 0353   BILIRUBINUR Negative 10/28/2012 1953   KETONESUR 20 (A) 05/15/2017 0353   PROTEINUR 100 (A) 05/15/2017 0353   NITRITE NEGATIVE 05/15/2017 0353   LEUKOCYTESUR NEGATIVE 05/15/2017 0353   LEUKOCYTESUR Negative 10/28/2012 1953     RADIOLOGY: Dg Chest 2 View  Result Date: 05/15/2017 CLINICAL DATA:  Abdominal pain. EXAM: CHEST  2 VIEW COMPARISON:  None. FINDINGS: The cardiomediastinal contours are normal. The lungs are clear. Pulmonary vasculature is normal. No consolidation, pleural effusion, or pneumothorax. No acute osseous abnormalities are seen. IMPRESSION: No acute pulmonary process. Electronically Signed   By: Rubye Oaks M.D.   On: 05/15/2017 04:28    EKG: Orders placed or performed during the hospital encounter of 05/15/17  . EKG 12-Lead  . EKG 12-Lead  . ED EKG within 10 minutes  . ED EKG within 10 minutes    IMPRESSION AND PLAN: 46 year old male patient with history of pancreatitis, alcohol abuse presented to the emergency room with abdominal pain.  Admitting diagnosis 1. Acute on chronic pancreatitis 2. Abdominal pain 3. Alcohol abuse 4.dehydration Treatment plan Admit patient to medical floor IV fluid hydration Nothing by mouth for now Antiemetics Pain management with IV Dilaudid DVT prophylaxis subcutaneous Lovenox 40 MG daily Repeat lipase level  All the records are reviewed and case discussed with ED provider. Management plans discussed with the patient, family and they are in agreement.  CODE STATUS:FULL CODE Code Status History    Date Active Date Inactive Code Status Order ID Comments User Context   12/19/2016  8:08 AM 12/24/2016  6:18 PM Full Code [id number]  Adrian Saran, MD Inpatient       TOTAL TIME TAKING CARE OF THIS PATIENT: 50 minutes.  Ihor Austin M.D on 05/15/2017 at 6:20 AM  Between 7am to 6pm - Pager - [phone]  After 6pm go to [email or web address] - password [email or web address]  [email or web address]  [facility]  Office  [phone]  CC: Primary care physician; Patient, No Pcp Per

## 2017-05-15 NOTE — Care Management Note (Addendum)
Case Management Note  Patient Details  NameYUSUKE Thompson MRN: [id number] Date of Birth: [birth year]  Subjective/Objective:     Consult requesting resources for AA was sent to CM instead of to Social Work. Social worker was notified.                Action/Plan:   Expected Discharge Date:                  Expected Discharge Plan:     In-House Referral:     Discharge planning Services     Post Acute Care Choice:    Choice offered to:     DME Arranged:    DME Agency:     HH Arranged:    HH Agency:     Status of Service:     If discussed at [employer] of Stay Meetings, dates discussed:    Additional Comments:  Tomio Kirk A, RN 05/15/2017, 5:12 PM

## 2017-05-15 NOTE — Progress Notes (Signed)
The patient complains of abdominal pain with nausea. He wants more Dilaudid. His vital signs are stable. Acute pancreatitis. Continue current treatment, pain control with IV fluid support. Follow-up lipase tomorrow. Tobacco abuse. Smoking cessation was counseled for 4 minutes. Nicotine.  patch.  I discussed with the patient, his girlfriend and nurse.

## 2017-05-15 NOTE — Progress Notes (Signed)
Initially upon admission, pt and significant other requested increased pain med. Paged Dr. Imogene Burn with no new orders given. Staffed with Dr. Imogene Burn when he arrived on unit and post pt assessment. Orders updated with pt and girlfriend up dated. States he "just want to sleep".  Order review and plan of care review done with pt and girlfriend. Dilaudid and percocet given as requested and ordered. Detailed education done on treatment of pancreatitis with pt and girlfriend.

## 2017-05-15 NOTE — ED Provider Notes (Signed)
[facility] Emergency Department Provider Note   ____________________________________________   First MD Initiated Contact with Patient 05/15/17 [phone]     (approximate)  I have reviewed the triage vital signs and the nursing notes.   HISTORY  Chief Complaint Abdominal Pain    HPI Don Thompson is a 46 y.o. male Who comes into the hospital today with some bad abdominal pain. The patient states that he thinks is due to his pancreas. The patient does have a history of pancreatitis. He reports that he did have some symptoms a few days ago but it got worse in the last 24-48 hours. The patient states that he did have a few drinks recently and his pancreatitis has been linked to alcohol use. the patient reports that he vomited 4 times and it was food. He rates pain a 9-10 out of 10 in intensity. He has no fever no diarrhea or constipation. The patient denies any chest pain but had some shortness of breath due to how extreme the pain felt. The patient is here today for evaluation of his symptoms.   Past Medical History:  Diagnosis Date  . GERD (gastroesophageal reflux disease)   . Pancreatitis, alcoholic, acute     Patient Active Problem List   Diagnosis Date Noted  . Pancreatitis 12/19/2016  . Alcohol abuse 12/19/2016    History reviewed. No pertinent surgical history.  Prior to Admission medications   Medication Sig Start Date End Date Taking? Authorizing Provider  amLODipine (NORVASC) 5 MG tablet Take 1 tablet (5 mg total) by mouth daily. 12/24/16   Adrian Saran, MD  famotidine (PEPCID) 20 MG tablet Take 20 mg by mouth daily.    [provider]  omeprazole (PRILOSEC) 20 MG capsule Take 20 mg by mouth daily.    [provider]    Allergies Patient has no known allergies.  History reviewed. No pertinent family history.  Social History Social History  Substance Use Topics  . Smoking status: Current Every Day Smoker    Types:  Cigarettes  . Smokeless tobacco: Never Used  . Alcohol use Yes    Review of Systems  Constitutional: No fever/chills Eyes: No visual changes. ENT: No sore throat. Cardiovascular: Denies chest pain. Respiratory:  shortness of breath. Gastrointestinal:  abdominal pain,  nausea, vomiting.  No diarrhea.  No constipation. Genitourinary: Negative for dysuria. Musculoskeletal: Negative for back pain. Skin: Negative for rash. Neurological: Negative for headaches, focal weakness or numbness.   ____________________________________________   PHYSICAL EXAM:  VITAL SIGNS: ED Triage Vitals  Enc Vitals Group     BP 05/15/17 0339 105/80     Pulse Rate 05/15/17 0339 73     Resp 05/15/17 0339 (!) 22     Temp 05/15/17 0339 98.2 F (36.8 C)     Temp Source 05/15/17 0339 Oral     SpO2 05/15/17 0339 98 %     Weight 05/15/17 0339 203 lb (92.1 kg)     Height 05/15/17 0339  (1.676 m)     Head Circumference --      Peak Flow --      Pain Score 05/15/17 0338 10     Pain Loc --      Pain Edu? --      Excl. in GC? --     Constitutional: Alert and oriented. Well appearing and in moderate distress. Eyes: Conjunctivae are normal. PERRL. EOMI. Head: Atraumatic. Nose: No congestion/rhinnorhea. Mouth/Throat: Mucous membranes are moist.  Oropharynx non-erythematous.  Cardiovascular: Normal rate, regular rhythm. Grossly normal heart sounds.  Good peripheral circulation. Respiratory: Normal respiratory effort.  No retractions. Lungs CTAB. Gastrointestinal: Soft with some diffuse upper abdominal tenderness to palpation No distention. positive bowel sounds Musculoskeletal: No lower extremity tenderness nor edema.  Neurologic:  Normal speech and language.  Skin:  Skin is warm, dry and intact.  Psychiatric: Mood and affect are normal.   ____________________________________________   LABS (all labs ordered are listed, but only abnormal results are displayed)  Labs Reviewed  LIPASE, BLOOD -  Abnormal; Notable for the following:       Result Value   Lipase 1,533 (*)    All other components within normal limits  COMPREHENSIVE METABOLIC PANEL - Abnormal; Notable for the following:    Glucose, Bld 145 (*)    All other components within normal limits  CBC - Abnormal; Notable for the following:    WBC 13.1 (*)    All other components within normal limits  URINALYSIS, COMPLETE (UACMP) WITH MICROSCOPIC - Abnormal; Notable for the following:    Color, Urine YELLOW (*)    APPearance CLEAR (*)    Specific Gravity, Urine >1.046 (*)    Glucose, UA >=500 (*)    Ketones, ur 20 (*)    Protein, ur 100 (*)    Squamous Epithelial / LPF 0-5 (*)    All other components within normal limits  TROPONIN I   ____________________________________________  EKG  ED ECG REPORT I, Rebecka Apley, the attending physician, personally viewed and interpreted this ECG.   Date: 05/15/2017  EKG Time: 333  Rate: 74  Rhythm: normal sinus rhythm  Axis: normal  Intervals:none  ST&T Change: none  ____________________________________________  RADIOLOGY  Dg Chest 2 View  Result Date: 05/15/2017 CLINICAL DATA:  Abdominal pain. EXAM: CHEST  2 VIEW COMPARISON:  None. FINDINGS: The cardiomediastinal contours are normal. The lungs are clear. Pulmonary vasculature is normal. No consolidation, pleural effusion, or pneumothorax. No acute osseous abnormalities are seen. IMPRESSION: No acute pulmonary process. Electronically Signed   By: Rubye Oaks M.D.   On: 05/15/2017 04:28    ____________________________________________   PROCEDURES  Procedure(s) performed: None  Procedures  Critical Care performed: No  ____________________________________________   INITIAL IMPRESSION / ASSESSMENT AND PLAN / ED COURSE  Pertinent labs & imaging results that were available during my care of the patient were reviewed by me and considered in my medical decision making (see chart for details).  This is  a 46 year old male who comes into the hospital today with some upper abdominal pain. The patient is concerned that it may be due to his pancreas. My differential diagnosis includes gastritis, pancreatitis, biliary disease. The patient's blood work did return and the patient's lipase is 1533. Given the fact that the patient has had pancreatitis in the past and was drinking recently feel this is just plain pancreatitis. I will give the patient some Zofran, a liter of normal saline and a dose of Dilaudid. Given the patient's pain and vomiting he will be admitted to the hospitalist service for further evaluation.      ____________________________________________   FINAL CLINICAL IMPRESSION(S) / ED DIAGNOSES  Final diagnoses:  Alcohol-induced acute pancreatitis, unspecified complication status      NEW MEDICATIONS STARTED DURING THIS VISIT:  New Prescriptions   No medications on file     Note:  This document was prepared using Dragon voice recognition software and may include unintentional dictation errors.    Rebecka Apley,  MD 05/15/17 1610

## 2017-05-15 NOTE — Progress Notes (Signed)
Chaplain responded to an OR for prayer. [place] met pt and significant other at bedside. Pt states he is in lots pain this AM, requested [place] to ask nurse or dc to check on him. [place] asked charge nurse to inform pt's nurse that pt was in pain and needs immediate attention. [place] will follow up pt as needed.   05/15/17 1000  Clinical Encounter Type  Visited With Patient and family together  Visit Type Initial;Spiritual support  Referral From Nurse  Consult/Referral To Chaplain  Spiritual Encounters  Spiritual Needs Prayer

## 2017-05-15 NOTE — Progress Notes (Signed)
Chaplain made a follow up visit with pt and met pt and significant other at bedside. Pt states he has no pain at this time, he had just received another round of pain medication that had started kicking in. Keeping with earlier request, pt asked prayers for strength, good health and that he would be able to make right decisions. [place] offered prayers and pastoral presence.   05/15/17 1700  Clinical Encounter Type  Visited With Patient and family together  Visit Type Follow-up;Spiritual support  Referral From [place]

## 2017-05-15 NOTE — Progress Notes (Signed)
Pt reports pain control improved and sleeping/resting quietly. Sips of liquids observed only. Friend remains at bedside.

## 2017-05-15 NOTE — ED Notes (Signed)
Pt transported to room 125 

## 2017-05-15 NOTE — ED Triage Notes (Signed)
Pt presents w/ abdominal pain, n/ v x 4. Pt has hx of alcoholic pancreatitis. Pt consumed ETOH yesterday and has experienced pain since 1900 yesterday. Pt is pale, diaphoretic, and writhing in wheelchair.

## 2017-05-16 LAB — COMPREHENSIVE METABOLIC PANEL
ALT: 17 U/L (ref 17–63)
AST: 23 U/L (ref 15–41)
Albumin: 3.7 g/dL (ref 3.5–5.0)
Alkaline Phosphatase: 37 U/L — ABNORMAL LOW (ref 38–126)
Anion gap: 7 (ref 5–15)
BILIRUBIN TOTAL: 1.5 mg/dL — AB (ref 0.3–1.2)
BUN: 8 mg/dL (ref 6–20)
CALCIUM: 8.4 mg/dL — AB (ref 8.9–10.3)
CO2: 26 mmol/L (ref 22–32)
CREATININE: 0.87 mg/dL (ref 0.61–1.24)
Chloride: 102 mmol/L (ref 101–111)
GFR calc Af Amer: >60 mL/min (ref >60)
GLUCOSE: 97 mg/dL (ref 65–99)
Potassium: 3.6 mmol/L (ref 3.5–5.1)
Sodium: 135 mmol/L (ref 135–145)
TOTAL PROTEIN: 6.6 g/dL (ref 6.5–8.1)

## 2017-05-16 LAB — LIPASE, BLOOD: Lipase: 286 U/L — ABNORMAL HIGH (ref 11–51)

## 2017-05-16 MED ORDER — OXYCODONE-ACETAMINOPHEN 5-325 MG PO TABS: 1.0000 | ORAL_TABLET | Freq: Four times a day (QID) | ORAL | 0 refills | Status: DC | PRN

## 2017-05-16 MED ORDER — HYDROMORPHONE HCL 1 MG/ML IJ SOLN: 1.0000 mg | INTRAMUSCULAR | Status: DC | PRN

## 2017-05-16 NOTE — Discharge Instructions (Signed)
Low fat diet. Avoid alcohol.

## 2017-05-16 NOTE — Progress Notes (Signed)
Pt is being discharged home. Discharge papers given and explained to pt. Pt verbalized understanding. Meds and f/u appointment reviewed with pt. RX given. Work/school note given. Awaiting transportation.

## 2017-05-16 NOTE — Clinical Social Work Note (Signed)
CSW received verbal consult from [facility] that patient is requesting resources for substance use services. CSW will assess when able.  Argentina Ponder, MSW, Theresia Majors [phone]

## 2017-05-16 NOTE — Progress Notes (Signed)
[employer] - [place] at [facility] [place] was admitted to the Hospital on 05/15/2017 and Discharged  05/16/2017 and should be excused from work/school   for 2  days starting 05/15/2017 , may return to work/school without any restrictions.  Shaune Pollack M.D on 05/16/2017,at 1:13 PM  [employer] - [place] at [facility]  [phone]

## 2017-05-16 NOTE — Discharge Summary (Addendum)
[employer] - [place] at [facility]   PATIENT NAME: Don Thompson    MR#:  [id number]  DATE OF BIRTH:  1971-06-11  DATE OF ADMISSION:  05/15/2017   ADMITTING PHYSICIAN: Ihor [place], MD  DATE OF DISCHARGE:  05/16/2017 PRIMARY CARE PHYSICIAN: Patient, No Pcp Per   ADMISSION DIAGNOSIS:  Alcohol-induced acute pancreatitis, unspecified complication status [K85.20] DISCHARGE DIAGNOSIS:  Active Problems:   Acute pancreatitis  SECONDARY DIAGNOSIS:   Past Medical History:  Diagnosis Date  . GERD (gastroesophageal reflux disease)   . Pancreatitis, alcoholic, acute    HOSPITAL COURSE:  1. Acute on chronic pancreatitis Resolved.lipase decreased to 286. He tolerated diet. On Dilaudid when necessary and Percocet prn. 3. Alcohol abuse. No withdrawal. advised alcohol cessation. 4.dehydration. Improved. DISCHARGE CONDITIONS:  Stable, discharge home today. CONSULTS OBTAINED:   DRUG ALLERGIES:  No Known Allergies DISCHARGE MEDICATIONS:   Allergies as of 05/16/2017   No Known Allergies     Medication List    TAKE these medications   amLODipine 5 MG tablet Commonly known as:  NORVASC Take 1 tablet (5 mg total) by mouth daily.   doxylamine (Sleep) 25 MG tablet Commonly known as:  UNISOM Take 25 mg by mouth at bedtime as needed.   famotidine 20 MG tablet Commonly known as:  PEPCID Take 20 mg by mouth daily.   omeprazole 20 MG capsule Commonly known as:  PRILOSEC Take 20 mg by mouth daily.   oxyCODONE-acetaminophen 5-325 MG tablet Commonly known as:  PERCOCET/ROXICET Take 1 tablet by mouth every 6 (six) hours as needed for moderate pain.            Discharge Care Instructions        Start     Ordered   05/16/17 0000  oxyCODONE-acetaminophen (PERCOCET/ROXICET) 5-325 MG tablet  Every 6 hours PRN     05/16/17 0815   05/16/17 0000  Increase activity slowly     05/16/17 1012   05/16/17 0000  Diet - low sodium heart healthy     05/16/17 1012       DISCHARGE INSTRUCTIONS:  See AVS.  If you experience worsening of your admission symptoms, develop shortness of breath, life threatening emergency, suicidal or homicidal thoughts you must seek medical attention immediately by calling 911 or calling your MD immediately  if symptoms less severe.  You Must read complete instructions/literature along with all the possible adverse reactions/side effects for all the Medicines you take and that have been prescribed to you. Take any new Medicines after you have completely understood and accpet all the possible adverse reactions/side effects.   Please note  You were cared for by a hospitalist during your hospital stay. If you have any questions about your discharge medications or the care you received while you were in the hospital after you are discharged, you can call the unit and asked to speak with the hospitalist on call if the hospitalist that took care of you is not available. Once you are discharged, your primary care physician will handle any further medical issues. Please note that NO REFILLS for any discharge medications will be authorized once you are discharged, as it is imperative that you return to your primary care physician (or establish a relationship with a primary care physician if you do not have one) for your aftercare needs so that they can reassess your need for medications and monitor your lab values.    On the day of Discharge:  VITAL SIGNS:  Blood  pressure (!) 114/58, pulse (!) 106, temperature 98 F (36.7 C), temperature source Oral, resp. rate 14, height  (1.676 m), weight 200 lb 1.6 oz (90.8 kg), SpO2 95 %. PHYSICAL EXAMINATION:  GENERAL:  46 y.o.-year-old patient lying in the bed with no acute distress.  EYES: Pupils equal, round, reactive to light and accommodation. No scleral icterus. Extraocular muscles intact.  HEENT: Head atraumatic, normocephalic. Oropharynx and nasopharynx clear.  NECK:  Supple, no  jugular venous distention. No thyroid enlargement, no tenderness.  LUNGS: Normal breath sounds bilaterally, no wheezing, rales,rhonchi or crepitation. No use of accessory muscles of respiration.  CARDIOVASCULAR: S1, S2 normal. No murmurs, rubs, or gallops.  ABDOMEN: Soft, abdominal tenderness, non-distended. Bowel sounds present. No organomegaly or mass.  EXTREMITIES: No pedal edema, cyanosis, or clubbing.  NEUROLOGIC: Cranial nerves II through XII are intact. Muscle strength 5/5 in all extremities. Sensation intact. Gait not checked.  PSYCHIATRIC: The patient is alert and oriented x 3.  SKIN: No obvious rash, lesion, or ulcer.  DATA REVIEW:   CBC  Recent Labs Lab 05/15/17 0353  WBC 13.1*  HGB 16.4  HCT 47.0  PLT 254    Chemistries   Recent Labs Lab 05/16/17 0536  NA 135  K 3.6  CL 102  CO2 26  GLUCOSE 97  BUN 8  CREATININE 0.87  CALCIUM 8.4*  AST 23  ALT 17  ALKPHOS 37*  BILITOT 1.5*     Microbiology Results  No results found for this or any previous visit.  RADIOLOGY:  No results found.   Management plans discussed with the patient, family and they are in agreement.  CODE STATUS: Full Code   TOTAL TIME TAKING CARE OF THIS PATIENT: 31 minutes.    Shaune Pollack M.D on 05/16/2017 at 11:53 AM  Between 7am to 6pm - Pager - [phone]  After 6pm go to [email or web address] - Social research officer, government  [employer]  Hospitalists  Office  [phone]  CC: Primary care physician; Patient, No Pcp Per   Note: This dictation was prepared with Dragon dictation along with smaller phrase technology. Any transcriptional errors that result from this process are unintentional.

## 2017-05-16 NOTE — Clinical Social Work Note (Signed)
Clinical Social Work Assessment  Patient Details  Name: Don Thompson MRN: [id number] Date of Birth: May 29, 1971  Date of referral:  05/16/17               Reason for consult:  Substance Use/ETOH Abuse                Permission sought to share information with:    Permission granted to share information::     Name::        Agency::     Relationship::     Contact Information:     Housing/Transportation Living arrangements for the past 2 months:  Single Family Home Source of Information:  Patient, Medical Team Patient Interpreter Needed:  None Criminal Activity/Legal Involvement Pertinent to Current Situation/Hospitalization:  No - Comment as needed Significant Relationships:  Significant Other Lives with:  Domestic Partner Do you feel safe going back to the place where you live?  Yes Need for family participation in patient care:  No (Coment)  Care giving concerns: Patient requested more information about support for alcohol use disorder   Social Worker assessment / plan:  The CSW met with the patient at bedside to discuss his goals for recovery. The patient reported that he had been abstinent from alcohol use since May until 2 nights ago at which time he reports having "a drink". The CSW used motivational interviewing to determine that the patient had one mixed drink at a bar. The patient began to feel abdominal pain within an hour. The patient now has more insight into the direct effect of any alcohol use on his pancreatic functions.  The CSW also provided psychoeducation about diet related to pancreatitis (avoiding high fat foods) and the need for continued PCP follow-up to ensure that his pancreatitis does not lead to damage that could result in DM. The patient verbalized understanding.  The CSW provided a list of local clinical resources for substance use/alcohol use services ranging from outpatient to inpatient. The CSW also provided the requested list of [facility] and the [place] hotline number. The patient thanked the CSW and seems to have appropriate goals for follow-up care.  Employment status:  [employer] information:  Managed Care PT Recommendations:  Not assessed at this time Information / Referral to community resources:  Outpatient Substance Abuse Treatment Options, Residential Substance Abuse Treatment Options, Other (Comment Required) (Local AA meetings and resources in [facility])  Patient/Family's Response to care:  The patient thanked the CSW and indicated that he appreciated the CSW's levity and directness.  Patient/Family's Understanding of and Emotional Response to Diagnosis, Current Treatment, and Prognosis:  The patient seems to have better understanding of the effects of alcohol on his health, both acute and chronic, and he recognizes that preventative care will also assist in fewer episodes of pain.  Emotional Assessment Appearance:  Appears stated age Attitude/Demeanor/Rapport:   (Pleasant, linear in thought, goal oriented, and alert) Affect (typically observed):  Accepting, Appropriate, Pleasant Orientation:  Oriented to Self, Oriented to Place, Oriented to  Time, Oriented to Situation Alcohol / Substance use:  Alcohol Use (Hx of alcohol use with pancreatitis) Psych involvement (Current and /or in the community):  No (Comment)  Discharge Needs  Concerns to be addressed:  Coping/Stress Concerns, Compliance Issues Concerns, Substance Abuse Concerns Readmission within the last 30 days:  No Current discharge risk:  Substance Abuse Barriers to Discharge:  No Barriers Identified   Zettie Pho, LCSW 05/16/2017, 1:10 PM

## 2018-12-25 IMAGING — CT CT ABD-PELV W/ CM
2 of 8 series · 14 of 46 positions shown, 16 images · IV contrast (iopamidol)
Comparison: CT of the abdomen and pelvis performed 10/28/2012, and
right upper quadrant ultrasound performed 12/19/2016

CLINICAL DATA: Acute onset of epigastric abdominal pain and
elevated lipase. Nausea and vomiting. Initial encounter.

EXAM:
CT ABDOMEN AND PELVIS WITH CONTRAST
TECHNIQUE: Multidetector CT imaging of the abdomen and pelvis was performed
using the standard protocol following bolus administration of
intravenous contrast.
CONTRAST:  100mL [id number] IOPAMIDOL (XU333I-1FF) INJECTION 61%

[Series 2: routine abd/pel with · axial · 0.84mm/px · z∈[-994,-564]mm · 11 of 104 slices shown, 13 images]
[im 9/104  soft-tissue]
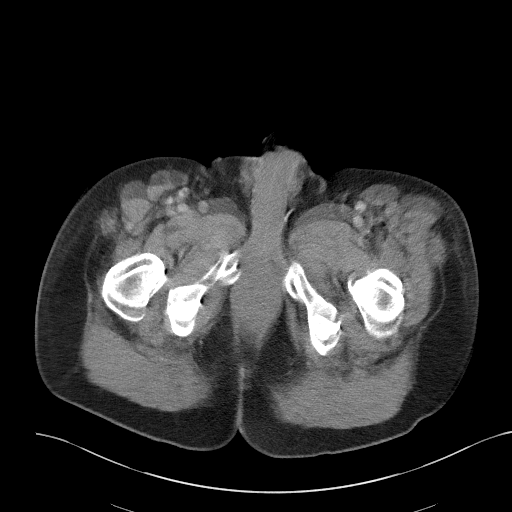
[im 9/104  bone]
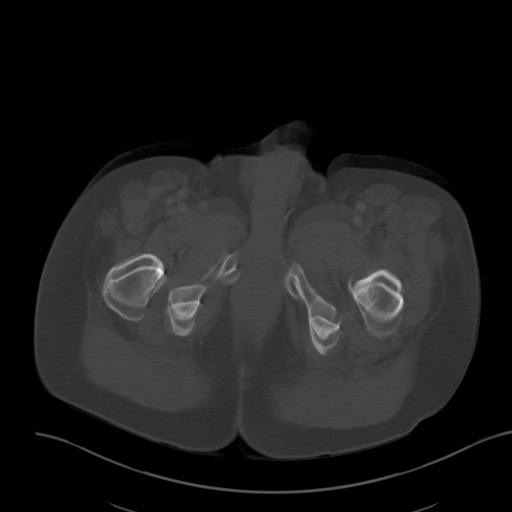
[im 18/104  soft-tissue]
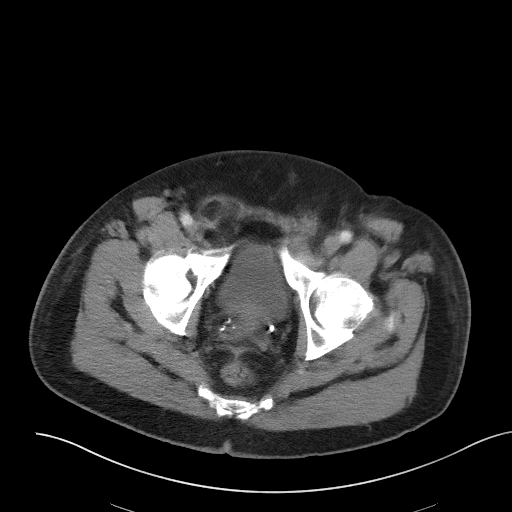
[im 26/104  soft-tissue]
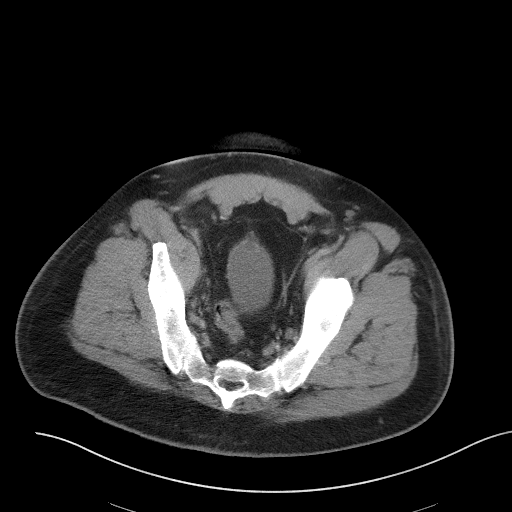
[im 35/104  soft-tissue]
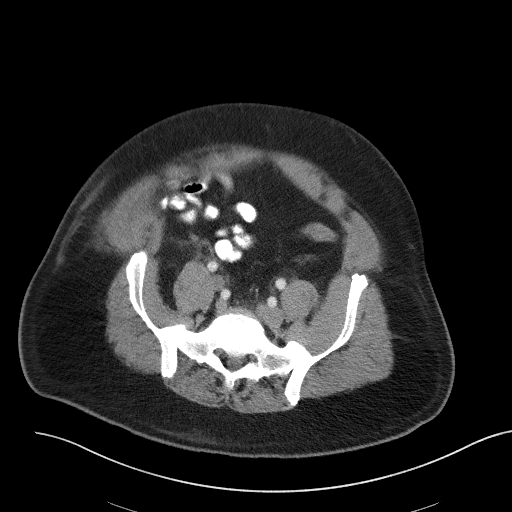
[im 43/104  soft-tissue]
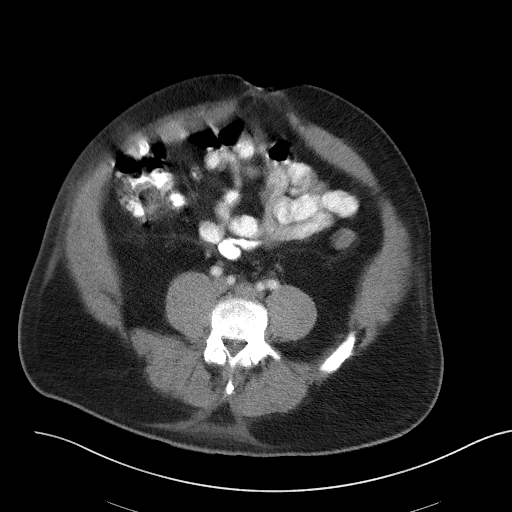
[im 52/104  soft-tissue]
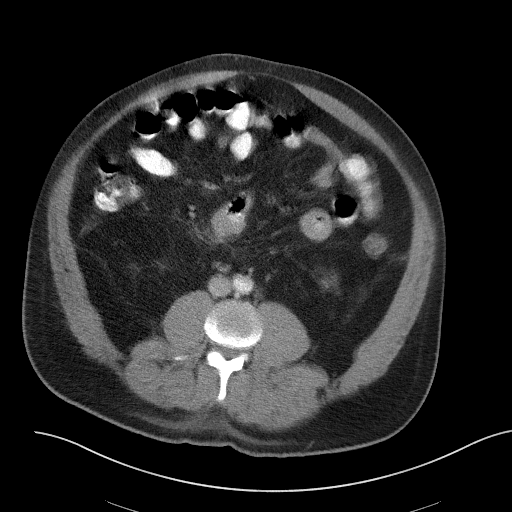
[im 61/104  soft-tissue]
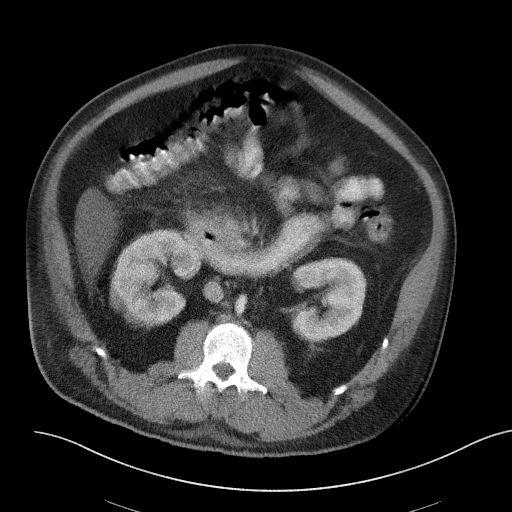
[im 69/104  soft-tissue]
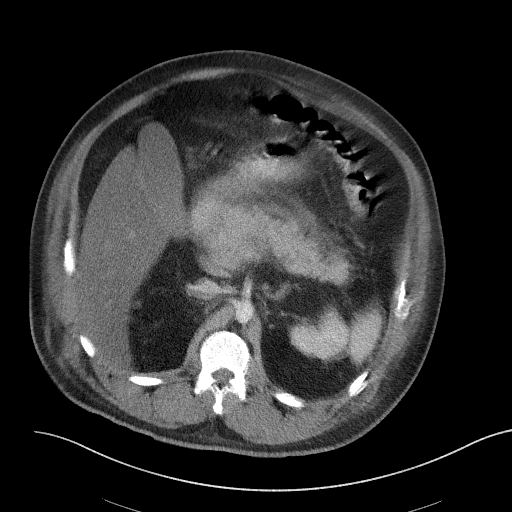
[im 78/104  soft-tissue]
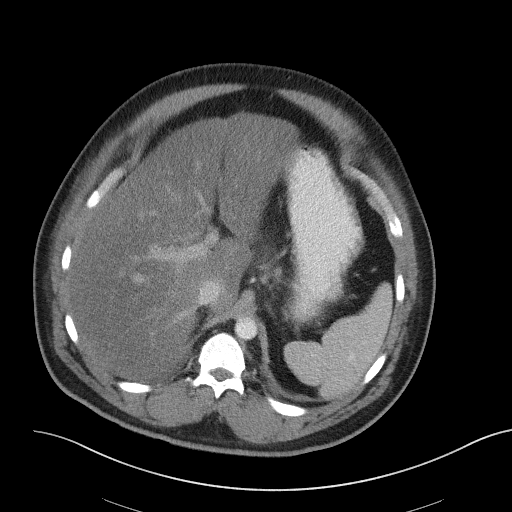
[im 78/104  bone]
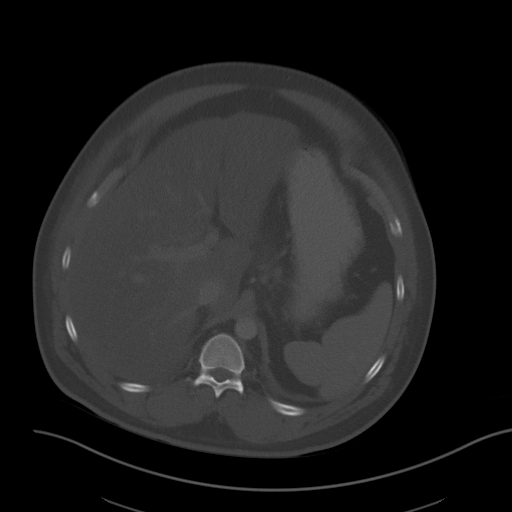
[im 86/104  soft-tissue]
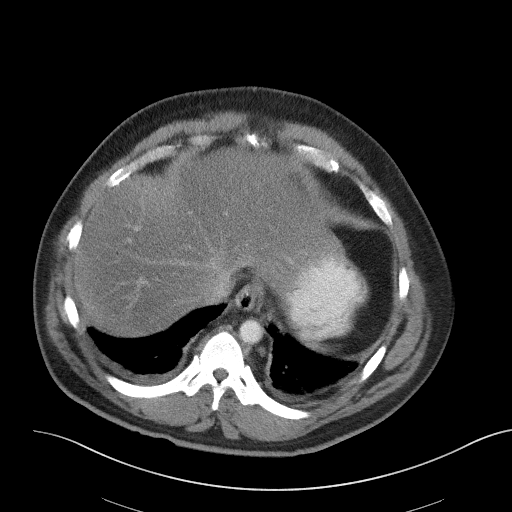
[im 95/104  soft-tissue]
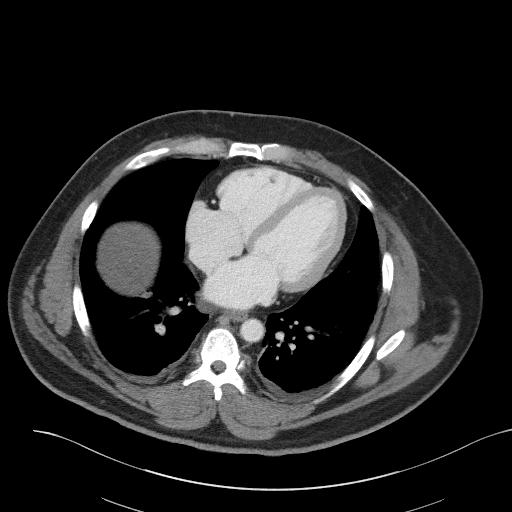

[Series 6: coronal st · coronal · 0.70mm/px · 3 of 107 slices shown]
[im 22/107  soft-tissue]
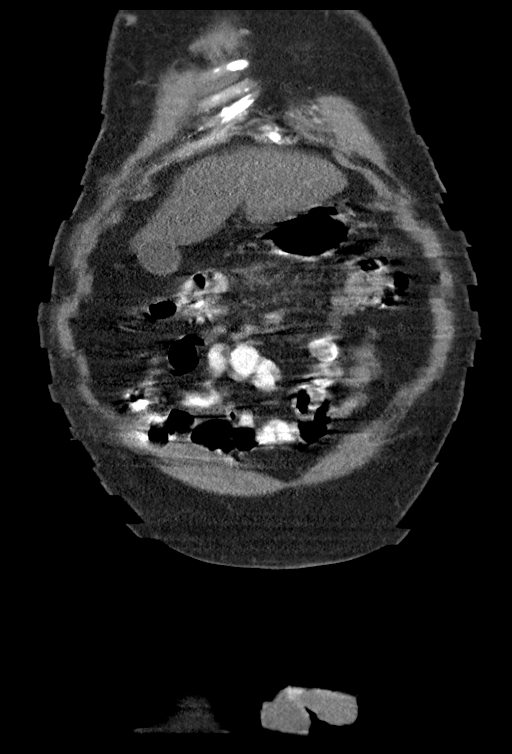
[im 43/107  soft-tissue]
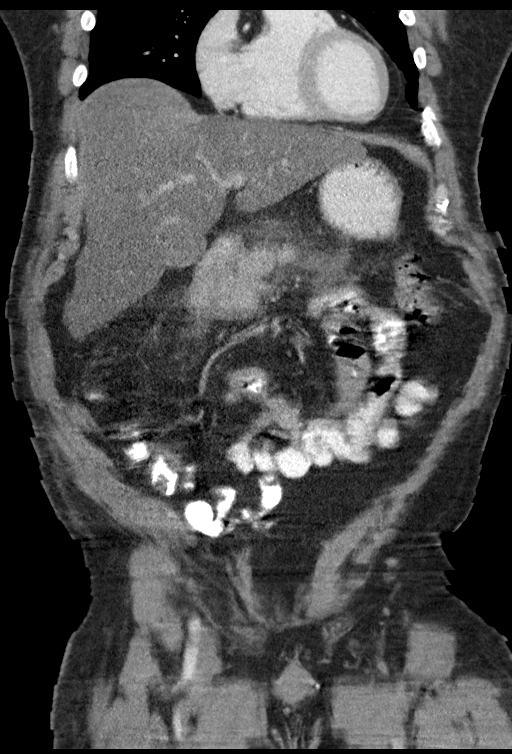
[im 64/107  soft-tissue]
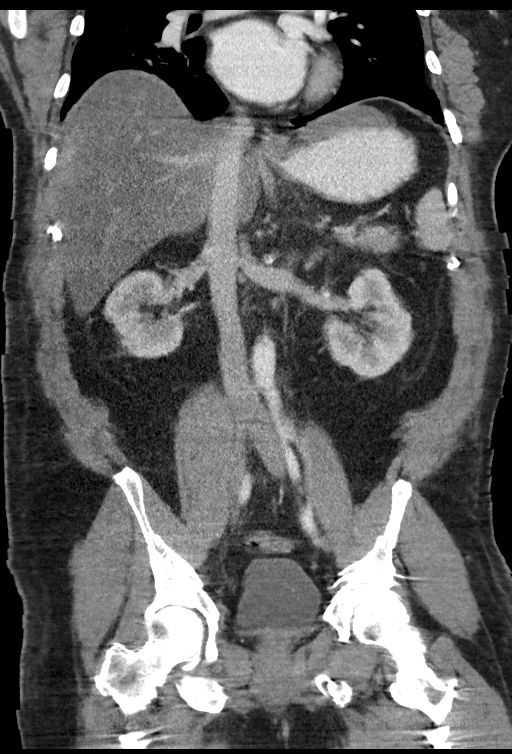

[14 of 46 positions shown; findings below may reference images not displayed]

FINDINGS: Lower chest: The visualized lung bases are grossly clear. The
visualized portions of the mediastinum are unremarkable.

Hepatobiliary: There is diffuse fatty infiltration within the liver,
with sparing about the gallbladder fossa. The gallbladder is grossly
unremarkable in appearance. The common bile duct remains normal in
caliber.

Pancreas: There is diffuse soft tissue inflammation about the
entirety of the pancreas, compatible with acute pancreatitis. There
is no definite evidence of devascularization or pseudocyst formation
at this time. Vague soft tissue inflammation tracks about the
mesentery and adjacent duodenum.

Spleen: The spleen is unremarkable in appearance.

Adrenals/Urinary Tract: The adrenal glands are unremarkable in
appearance.

Mild nonspecific perinephric stranding is noted bilaterally. The
kidneys are otherwise unremarkable. There is no evidence of
hydronephrosis. No renal or ureteral stones are identified.

Stomach/Bowel: The stomach is unremarkable in appearance. The small
bowel is within normal limits. The appendix is normal in caliber,
without evidence of appendicitis. The colon is unremarkable in
appearance, aside from a single diverticulum along the proximal
sigmoid colon. The bowel is difficult to fully assess due to motion
artifact.

Vascular/Lymphatic: The abdominal aorta is unremarkable in
appearance, though difficult to fully assess due to motion artifact.
The inferior vena cava is grossly unremarkable. No retroperitoneal
lymphadenopathy is seen. No pelvic sidewall lymphadenopathy is
identified.

Reproductive: The bladder is mildly distended and grossly
unremarkable. The prostate remains normal in size.

Other: No additional soft tissue abnormalities are seen. A small
right inguinal hernia is noted, containing only fat.

Musculoskeletal: No acute osseous abnormalities are identified. The
visualized musculature is unremarkable in appearance.
IMPRESSION: 1. Diffuse soft tissue inflammation about the entirety of the
pancreas, compatible with acute pancreatitis. No evidence of
devascularization or pseudocyst formation at this time. Vague soft
tissue inflammation tracks about the mesentery and adjacent
duodenum.
2. Small right inguinal hernia, containing only fat.
3. Diffuse fatty infiltration within the liver.

## 2019-05-18 IMAGING — CR DG CHEST 2V
2 series · 2 of 2 positions shown · non-contrast
Comparison: None.

CLINICAL DATA: Abdominal pain.

EXAM:
CHEST  2 VIEW

[chest pa]
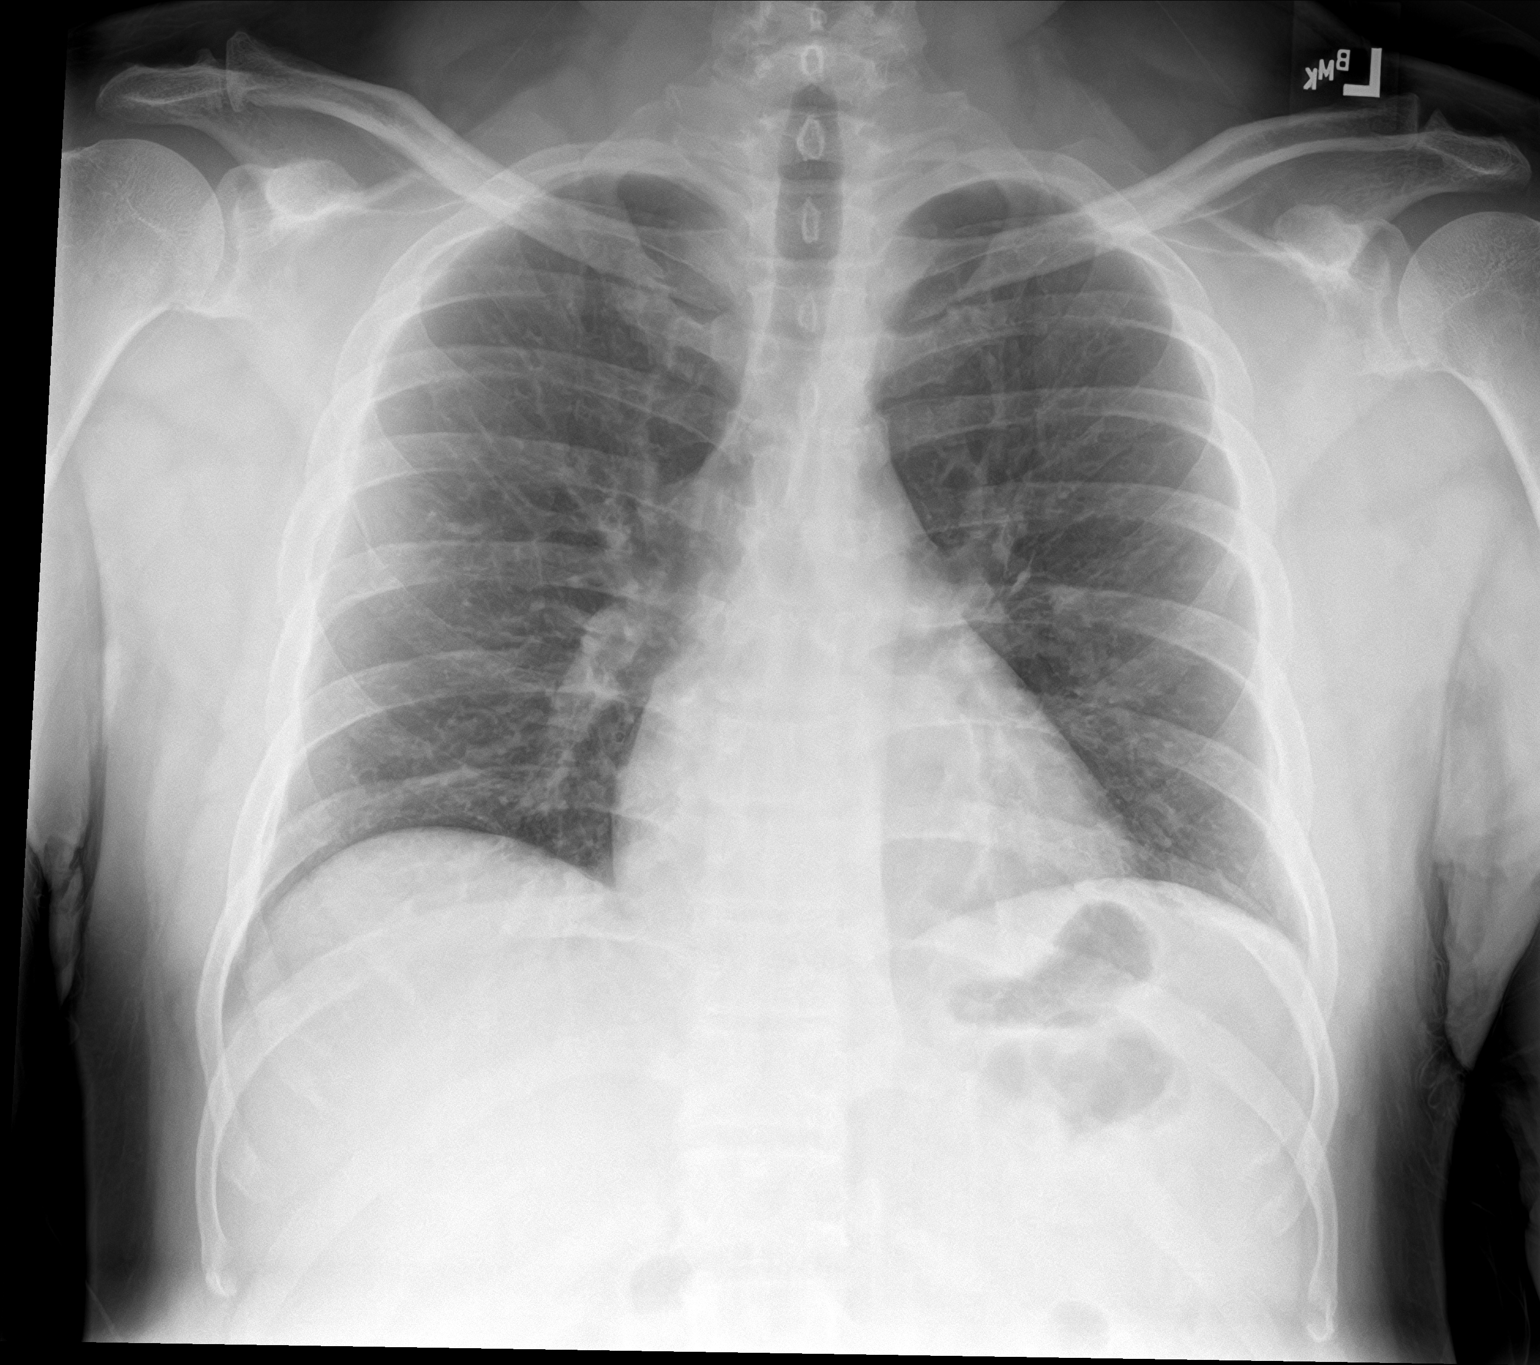

[chest lat]
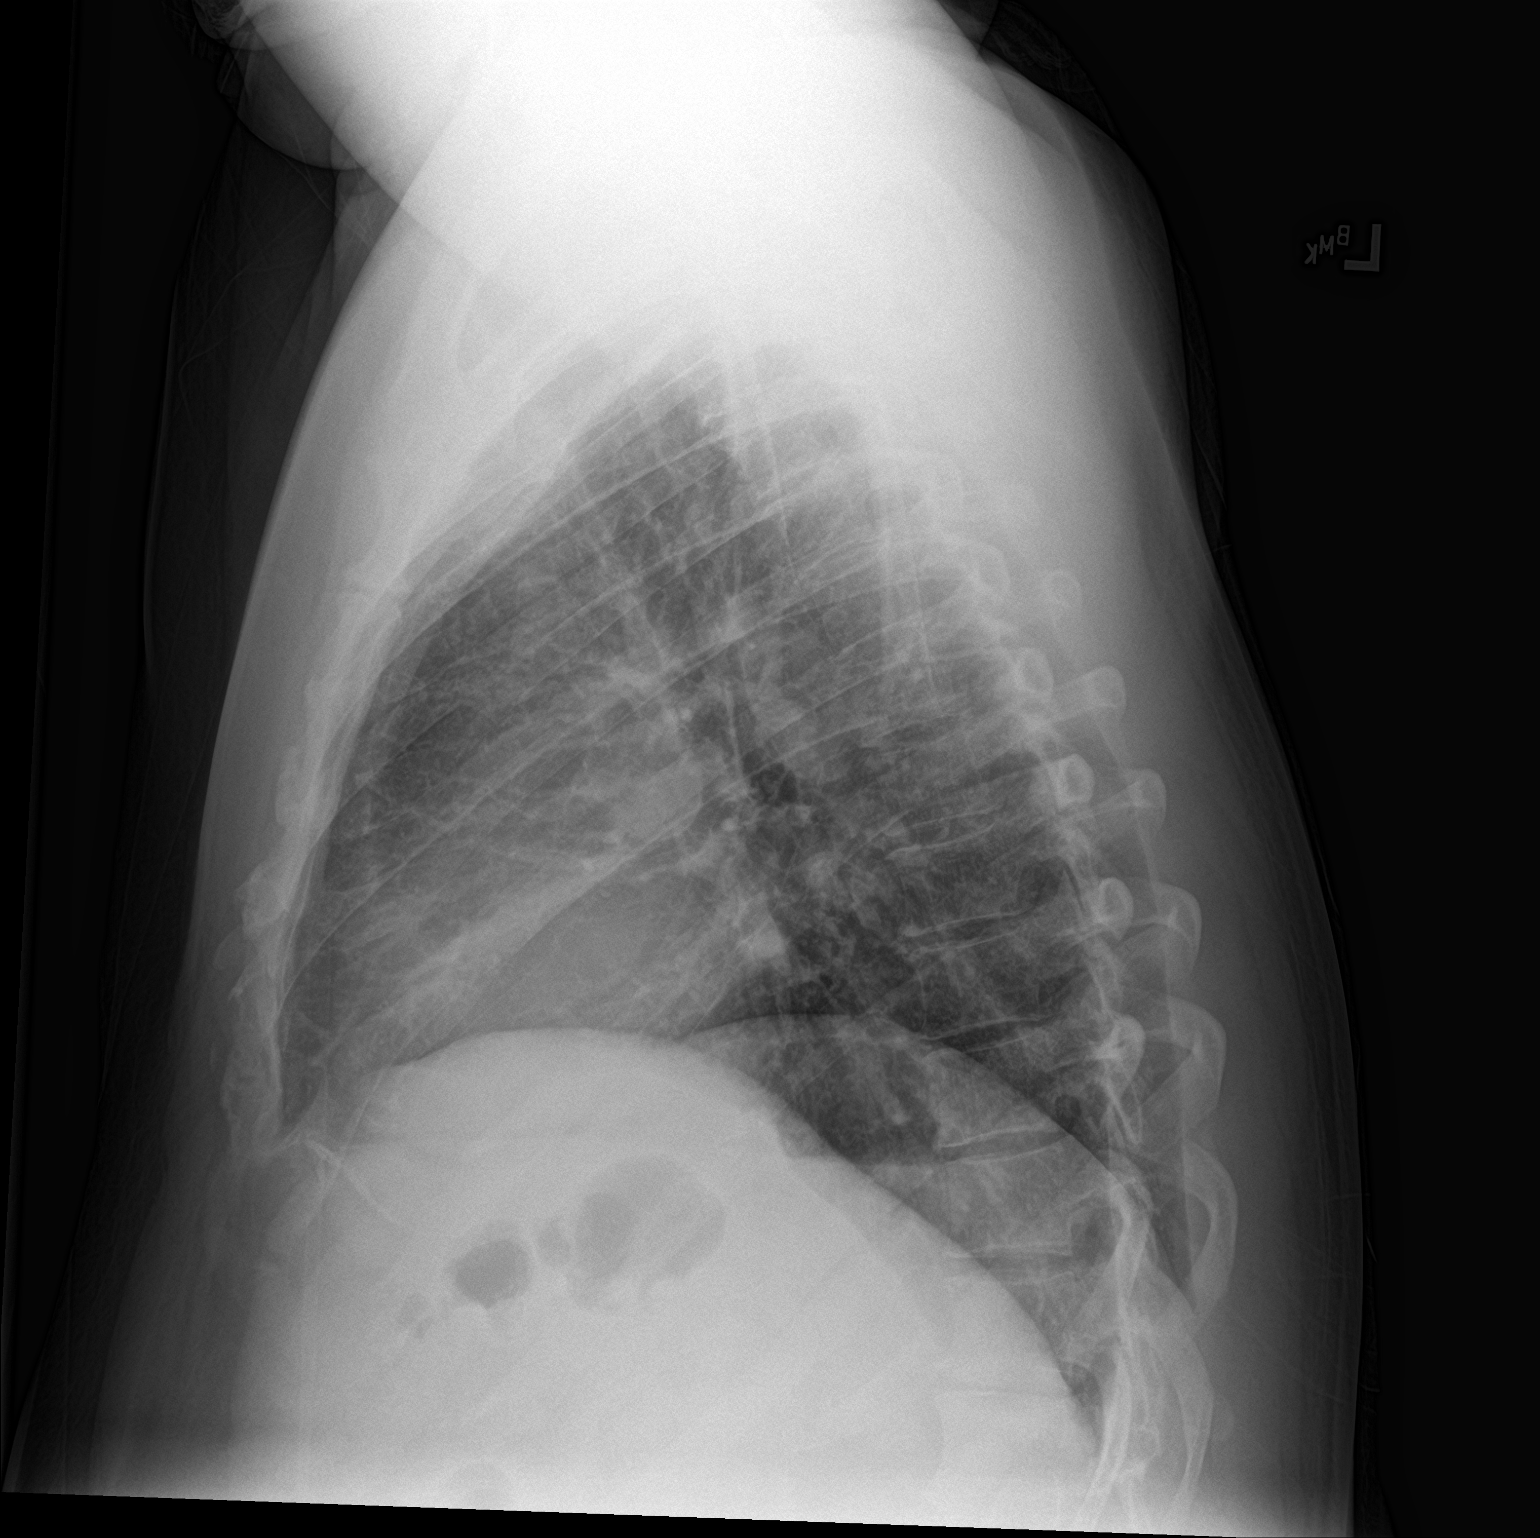

[2 of 2 positions shown; findings below may reference images not displayed]

FINDINGS: The cardiomediastinal contours are normal. The lungs are clear.
Pulmonary vasculature is normal. No consolidation, pleural effusion,
or pneumothorax. No acute osseous abnormalities are seen.
IMPRESSION: No acute pulmonary process.

## 2020-03-25 ENCOUNTER — Encounter: Payer: Self-pay | Admitting: Emergency Medicine

## 2020-03-25 ENCOUNTER — Emergency Department
Admission: EM | Admit: 2020-03-25 | Discharge: 2020-03-25 | Disposition: A | Discharge status: Home / Self Care | Payer: BC Managed Care – PPO | Attending: Emergency Medicine | Admitting: Emergency Medicine

## 2020-03-25 ENCOUNTER — Other Ambulatory Visit: Payer: Self-pay

## 2020-03-25 DIAGNOSIS — F17210 Nicotine dependence, cigarettes, uncomplicated: Secondary | ICD-10-CM | POA: Diagnosis not present

## 2020-03-25 DIAGNOSIS — X58XXXA Exposure to other specified factors, initial encounter: Secondary | ICD-10-CM | POA: Insufficient documentation

## 2020-03-25 DIAGNOSIS — S0501XA Injury of conjunctiva and corneal abrasion without foreign body, right eye, initial encounter: Secondary | ICD-10-CM | POA: Insufficient documentation

## 2020-03-25 DIAGNOSIS — Y9389 Activity, other specified: Secondary | ICD-10-CM | POA: Insufficient documentation

## 2020-03-25 DIAGNOSIS — H5711 Ocular pain, right eye: Secondary | ICD-10-CM | POA: Diagnosis present

## 2020-03-25 DIAGNOSIS — Y9289 Other specified places as the place of occurrence of the external cause: Secondary | ICD-10-CM | POA: Diagnosis not present

## 2020-03-25 DIAGNOSIS — Y999 Unspecified external cause status: Secondary | ICD-10-CM | POA: Insufficient documentation

## 2020-03-25 MED ORDER — TOBRAMYCIN 0.3 % OP SOLN: 2.0000 [drp] | OPHTHALMIC | 0 refills | Status: AC

## 2020-03-25 MED ORDER — TETRACAINE HCL 0.5 % OP SOLN
1.0000 [drp] | Freq: Once | OPHTHALMIC | Status: AC
  Administered 2020-03-25: 1 [drp] via OPHTHALMIC
  Filled 2020-03-25: qty 4

## 2020-03-25 MED ORDER — EYE WASH OPHTH SOLN
1.0000 [drp] | OPHTHALMIC | Status: DC | PRN
  Filled 2020-03-25: qty 118

## 2020-03-25 MED ORDER — FLUORESCEIN SODIUM 1 MG OP STRP
1.0000 | ORAL_STRIP | Freq: Once | OPHTHALMIC | Status: AC
  Administered 2020-03-25: 1 via OPHTHALMIC
  Filled 2020-03-25: qty 1

## 2020-03-25 NOTE — ED Provider Notes (Signed)
[facility] Emergency Department Provider Note  ____________________________________________   First MD Initiated Contact with Patient 03/25/20 1154     (approximate)  I have reviewed the triage vital signs and the nursing notes.   HISTORY  Chief Complaint Eye Pain   HPI Don Thompson is a 49 y.o. male presents to the ED with complaint of right pain and foreign body sensation.  Patient states that this started after mowing his yard yesterday.  He states he was wearing goggles but recalls wiping his eye several times.  He denies any lashes matted shut or changes in his vision.       Past Medical History:  Diagnosis Date  . GERD (gastroesophageal reflux disease)   . Pancreatitis, alcoholic, acute     Patient Active Problem List   Diagnosis Date Noted  . Acute pancreatitis 05/15/2017  . Pancreatitis 12/19/2016  . Alcohol abuse 12/19/2016    Past Surgical History:  Procedure Laterality Date  . none      Prior to Admission medications   Medication Sig Start Date End Date Taking? Authorizing Provider  doxylamine, Sleep, (UNISOM) 25 MG tablet Take 25 mg by mouth at bedtime as needed.    [provider]  famotidine (PEPCID) 20 MG tablet Take 20 mg by mouth daily.    [provider]  tobramycin (TOBREX) 0.3 % ophthalmic solution Place 2 drops into the right eye every 4 (four) hours. 03/25/20   Tommi Rumps, PA-C    Allergies Patient has no known allergies.  Family History  Problem Relation Age of Onset  . Multiple sclerosis Mother   . Alcoholism Father     Social History Social History   Tobacco Use  . Smoking status: Current Every Day Smoker    Types: Cigarettes  . Smokeless tobacco: Never Used  Vaping Use  . Vaping Use: Never used  Substance Use Topics  . Alcohol use: Yes  . Drug use: Yes    Types: Marijuana    Review of Systems Constitutional: No fever/chills Eyes: Right eye pain. ENT: No sore  throat. Cardiovascular: Denies chest pain. Respiratory: Denies shortness of breath. Gastrointestinal:   No nausea, no vomiting.  Musculoskeletal: Negative for muscle skeletal pain. Skin: Negative for rash. Neurological: Negative for headaches, focal weakness or numbness. ____________________________________________   PHYSICAL EXAM:  VITAL SIGNS: ED Triage Vitals  Enc Vitals Group     BP 03/25/20 1150 140/78     Pulse Rate 03/25/20 1150 78     Resp 03/25/20 1150 18     Temp 03/25/20 1150 98 F (36.7 C)     Temp Source 03/25/20 1150 Oral     SpO2 03/25/20 1150 98 %     Weight 03/25/20 1058 198 lb 6.6 oz (90 kg)     Height 03/25/20 1058 5\' 6"  (1.676 m)     Head Circumference --      Peak Flow --      Pain Score 03/25/20 1058 5     Pain Loc --      Pain Edu? --      Excl. in GC? --     Constitutional: Alert and oriented. Well appearing and in no acute distress. Eyes: Conjunctive on the right is mildly injected.  Left is normal.  PERRL. EOMI. right eyelid was everted and no foreign body was noted.  Fluorescein stain showed a very small thin linear corneal abrasion starting at approximately 4:00 and going to the 8 o'clock position.  No foreign body was seen. Head: Atraumatic. Nose: No congestion/rhinnorhea. Neck: No stridor.   Cardiovascular: Normal rate, regular rhythm. Grossly normal heart sounds.  Good peripheral circulation. Respiratory: Normal respiratory effort.  No retractions. Lungs CTAB. Musculoskeletal: Upper and lower extremities they have difficulty.  Normal gait was noted. Neurologic:  Normal speech and language. No gross focal neurologic deficits are appreciated.  Skin:  Skin is warm, dry and intact. No rash noted. Psychiatric: Mood and affect are normal. Speech and behavior are normal.  ____________________________________________   LABS (all labs ordered are listed, but only abnormal results are displayed)  Labs Reviewed - No data to  display   PROCEDURES  Procedure(s) performed (including Critical Care):  Procedures As noted above for eye exam.  ____________________________________________   INITIAL IMPRESSION / ASSESSMENT AND PLAN / ED COURSE  As part of my medical decision making, I reviewed the following data within the electronic MEDICAL RECORD NUMBER Notes from prior ED visits and [place] Controlled Substance Database  Alford E Normington was evaluated in Emergency Department on 03/25/2020 for the symptoms described in the history of present illness. He was evaluated in the context of the global COVID-19 pandemic, which necessitated consideration that the patient might be at risk for infection with the SARS-CoV-2 virus that causes COVID-19. Institutional protocols and algorithms that pertain to the evaluation of patients at risk for COVID-19 are in a state of rapid change based on information released by regulatory bodies including the CDC and federal and state organizations. These policies and algorithms were followed during the patient's care in the ED.  50 year old male presents to the ED with complaint of right eye pain after getting possible foreign body in his eye while mowing his yard yesterday.  Patient irrigated his eye yesterday but still feels a foreign body sensation.  Fluorescein dye shows a very superficial linear corneal abrasion noted at the 4:00 to 8 o'clock position.  Patient was prescribed Tobrex ophthalmic solution to use every 4 hours while awake.  He is to follow-up with [facility] and contact information was listed on his discharge papers if not improving in 24 hours.  ____________________________________________   FINAL CLINICAL IMPRESSION(S) / ED DIAGNOSES  Final diagnoses:  Abrasion of right cornea, initial encounter     ED Discharge Orders         Ordered    tobramycin (TOBREX) 0.3 % ophthalmic solution  Every 4 hours     Discontinue  Reprint     03/25/20 1232           Note:   This document was prepared using Dragon voice recognition software and may include unintentional dictation errors.    Tommi Rumps, PA-C 03/25/20 1457    [place]ton [place], MD 03/25/20 [phone]

## 2020-03-25 NOTE — Discharge Instructions (Signed)
Follow-up with [facility] if any continued problems or improvement tomorrow.  Begin using the Tobrex ophthalmic solution 2 drops to your right eye every 4 hours while awake.  Do not continue rubbing your eye which will aggravate this more.

## 2020-03-25 NOTE — ED Triage Notes (Signed)
Presents with pain and swelling to right eye  Developed pain after mowing yard yesterday

## 2020-05-15 ENCOUNTER — Emergency Department
Admission: EM | Admit: 2020-05-15 | Discharge: 2020-05-15 | Disposition: A | Discharge status: Home / Self Care | Payer: BC Managed Care – PPO | Attending: Student in an Organized Health Care Education/Training Program | Admitting: Student in an Organized Health Care Education/Training Program

## 2020-05-15 ENCOUNTER — Encounter: Payer: Self-pay | Admitting: Emergency Medicine

## 2020-05-15 ENCOUNTER — Other Ambulatory Visit: Payer: Self-pay

## 2020-05-15 DIAGNOSIS — R110 Nausea: Secondary | ICD-10-CM | POA: Insufficient documentation

## 2020-05-15 DIAGNOSIS — Z5321 Procedure and treatment not carried out due to patient leaving prior to being seen by health care provider: Secondary | ICD-10-CM | POA: Insufficient documentation

## 2020-05-15 DIAGNOSIS — R109 Unspecified abdominal pain: Secondary | ICD-10-CM | POA: Insufficient documentation

## 2020-05-15 LAB — COMPREHENSIVE METABOLIC PANEL
ALT: 33 U/L (ref 0–44)
AST: 37 U/L (ref 15–41)
Albumin: 4.6 g/dL (ref 3.5–5.0)
Alkaline Phosphatase: 48 U/L (ref 38–126)
Anion gap: 12 (ref 5–15)
BUN: 9 mg/dL (ref 6–20)
CO2: 20 mmol/L — ABNORMAL LOW (ref 22–32)
Calcium: 9.2 mg/dL (ref 8.9–10.3)
Chloride: 103 mmol/L (ref 98–111)
Creatinine, Ser: 0.97 mg/dL (ref 0.61–1.24)
GFR calc Af Amer: >60 mL/min (ref >60)
GFR calc non Af Amer: >60 mL/min (ref >60)
Glucose, Bld: 122 mg/dL — ABNORMAL HIGH (ref 70–99)
Potassium: 3.6 mmol/L (ref 3.5–5.1)
Sodium: 135 mmol/L (ref 135–145)
Total Bilirubin: 1.7 mg/dL — ABNORMAL HIGH (ref 0.3–1.2)
Total Protein: 7.6 g/dL (ref 6.5–8.1)

## 2020-05-15 LAB — CBC
HCT: 43.1 % (ref 39.0–52.0)
Hemoglobin: 15.6 g/dL (ref 13.0–17.0)
MCH: 33.6 pg (ref 26.0–34.0)
MCHC: 36.2 g/dL — ABNORMAL HIGH (ref 30.0–36.0)
MCV: 92.9 fL (ref 80.0–100.0)
Platelets: 188 10*3/uL (ref 150–400)
RBC: 4.64 MIL/uL (ref 4.22–5.81)
RDW: 12.6 % (ref 11.5–15.5)
WBC: 10 10*3/uL (ref 4.0–10.5)
nRBC: 0 % (ref 0.0–0.2)

## 2020-05-15 LAB — URINALYSIS, COMPLETE (UACMP) WITH MICROSCOPIC
Bacteria, UA: NONE SEEN
Bilirubin Urine: NEGATIVE
Glucose, UA: >=500 mg/dL — AB
Hgb urine dipstick: NEGATIVE
Ketones, ur: 5 mg/dL — AB
Leukocytes,Ua: NEGATIVE
Nitrite: NEGATIVE
Protein, ur: 30 mg/dL — AB
Specific Gravity, Urine: 1.031 — ABNORMAL HIGH (ref 1.005–1.030)
Squamous Epithelial / LPF: NONE SEEN (ref 0–5)
pH: 5 (ref 5.0–8.0)

## 2020-05-15 LAB — LIPASE, BLOOD: Lipase: 458 U/L — ABNORMAL HIGH (ref 11–51)

## 2020-05-15 MED ORDER — SODIUM CHLORIDE 0.9 % IV BOLUS
1000.0000 mL | Freq: Once | INTRAVENOUS | Status: AC
  Administered 2020-05-15: 14:00:00 1000 mL via INTRAVENOUS

## 2020-05-15 MED ORDER — MORPHINE SULFATE (PF) 4 MG/ML IV SOLN
4.0000 mg | Freq: Once | INTRAVENOUS | Status: AC
  Administered 2020-05-15: 14:00:00 4 mg via INTRAVENOUS
  Filled 2020-05-15: qty 1

## 2020-05-15 MED ORDER — ONDANSETRON HCL 4 MG/2ML IJ SOLN
4.0000 mg | Freq: Once | INTRAMUSCULAR | Status: AC
  Administered 2020-05-15: 14:00:00 4 mg via INTRAVENOUS
  Filled 2020-05-15: qty 2

## 2020-05-15 NOTE — ED Triage Notes (Signed)
Pt reports abd pain and some distention for 2 days. Pt reports nausea since yesterday. Pt reports feels like his pancreas and has hx of pancreatitis.

## 2020-05-15 NOTE — ED Notes (Signed)
This RN spoke with Scotty Court, EDP regarding pain medication, pt seen at [facility] 9/28, discussed with Dr. Scotty Court, no new orders for pain medication until evaluated by EDP.

## 2020-05-15 NOTE — ED Notes (Signed)
This RN to subwait, IV removed per patient request at this time. Pt visualized leaving with family member at this time, ambulatory with steady gait.

## 2023-05-13 DIAGNOSIS — R9431 Abnormal electrocardiogram [ECG] [EKG]: Secondary | ICD-10-CM | POA: Diagnosis not present

## 2023-05-14 DIAGNOSIS — K859 Acute pancreatitis without necrosis or infection, unspecified: Secondary | ICD-10-CM | POA: Diagnosis not present

## 2023-05-14 DIAGNOSIS — K760 Fatty (change of) liver, not elsewhere classified: Secondary | ICD-10-CM | POA: Diagnosis not present

## 2023-05-14 DIAGNOSIS — R45851 Suicidal ideations: Secondary | ICD-10-CM | POA: Diagnosis not present

## 2023-05-14 DIAGNOSIS — F10239 Alcohol dependence with withdrawal, unspecified: Secondary | ICD-10-CM | POA: Diagnosis not present

## 2023-05-15 DIAGNOSIS — F418 Other specified anxiety disorders: Secondary | ICD-10-CM | POA: Diagnosis not present

## 2023-05-15 DIAGNOSIS — K859 Acute pancreatitis without necrosis or infection, unspecified: Secondary | ICD-10-CM | POA: Diagnosis not present

## 2023-05-15 DIAGNOSIS — F10930 Alcohol use, unspecified with withdrawal, uncomplicated: Secondary | ICD-10-CM | POA: Diagnosis not present

## 2023-05-15 DIAGNOSIS — D696 Thrombocytopenia, unspecified: Secondary | ICD-10-CM | POA: Diagnosis not present

## 2023-05-15 DIAGNOSIS — R9431 Abnormal electrocardiogram [ECG] [EKG]: Secondary | ICD-10-CM | POA: Diagnosis not present

## 2023-05-16 DIAGNOSIS — E119 Type 2 diabetes mellitus without complications: Secondary | ICD-10-CM | POA: Diagnosis not present

## 2023-05-16 DIAGNOSIS — F10230 Alcohol dependence with withdrawal, uncomplicated: Secondary | ICD-10-CM | POA: Diagnosis not present

## 2023-05-16 DIAGNOSIS — D696 Thrombocytopenia, unspecified: Secondary | ICD-10-CM | POA: Diagnosis not present

## 2023-05-16 DIAGNOSIS — K852 Alcohol induced acute pancreatitis without necrosis or infection: Secondary | ICD-10-CM | POA: Diagnosis not present

## 2023-05-17 DIAGNOSIS — F10930 Alcohol use, unspecified with withdrawal, uncomplicated: Secondary | ICD-10-CM | POA: Diagnosis not present

## 2023-05-17 DIAGNOSIS — D696 Thrombocytopenia, unspecified: Secondary | ICD-10-CM | POA: Diagnosis not present

## 2023-05-17 DIAGNOSIS — E119 Type 2 diabetes mellitus without complications: Secondary | ICD-10-CM | POA: Diagnosis not present

## 2023-05-17 DIAGNOSIS — K852 Alcohol induced acute pancreatitis without necrosis or infection: Secondary | ICD-10-CM | POA: Diagnosis not present

## 2023-12-17 DIAGNOSIS — Z888 Allergy status to other drugs, medicaments and biological substances status: Secondary | ICD-10-CM | POA: Diagnosis not present

## 2023-12-17 DIAGNOSIS — R7401 Elevation of levels of liver transaminase levels: Secondary | ICD-10-CM | POA: Diagnosis not present

## 2023-12-17 DIAGNOSIS — E785 Hyperlipidemia, unspecified: Secondary | ICD-10-CM | POA: Diagnosis not present

## 2023-12-17 DIAGNOSIS — E1310 Other specified diabetes mellitus with ketoacidosis without coma: Secondary | ICD-10-CM | POA: Diagnosis not present

## 2023-12-17 DIAGNOSIS — F32A Depression, unspecified: Secondary | ICD-10-CM | POA: Diagnosis not present

## 2023-12-17 DIAGNOSIS — D696 Thrombocytopenia, unspecified: Secondary | ICD-10-CM | POA: Diagnosis not present

## 2023-12-17 DIAGNOSIS — E1110 Type 2 diabetes mellitus with ketoacidosis without coma: Secondary | ICD-10-CM | POA: Diagnosis not present

## 2023-12-17 DIAGNOSIS — E872 Acidosis, unspecified: Secondary | ICD-10-CM | POA: Diagnosis not present

## 2023-12-17 DIAGNOSIS — F17290 Nicotine dependence, other tobacco product, uncomplicated: Secondary | ICD-10-CM | POA: Diagnosis not present

## 2023-12-17 DIAGNOSIS — I10 Essential (primary) hypertension: Secondary | ICD-10-CM | POA: Diagnosis not present

## 2023-12-17 DIAGNOSIS — F10130 Alcohol abuse with withdrawal, uncomplicated: Secondary | ICD-10-CM | POA: Diagnosis not present

## 2023-12-17 DIAGNOSIS — Z833 Family history of diabetes mellitus: Secondary | ICD-10-CM | POA: Diagnosis not present

## 2023-12-17 DIAGNOSIS — Z79899 Other long term (current) drug therapy: Secondary | ICD-10-CM | POA: Diagnosis not present

## 2023-12-17 DIAGNOSIS — R1013 Epigastric pain: Secondary | ICD-10-CM | POA: Diagnosis not present

## 2023-12-17 DIAGNOSIS — F10239 Alcohol dependence with withdrawal, unspecified: Secondary | ICD-10-CM | POA: Diagnosis not present

## 2023-12-17 DIAGNOSIS — K852 Alcohol induced acute pancreatitis without necrosis or infection: Secondary | ICD-10-CM | POA: Diagnosis not present

## 2023-12-17 DIAGNOSIS — F419 Anxiety disorder, unspecified: Secondary | ICD-10-CM | POA: Diagnosis not present

## 2023-12-17 DIAGNOSIS — K858 Other acute pancreatitis without necrosis or infection: Secondary | ICD-10-CM | POA: Diagnosis not present

## 2023-12-17 DIAGNOSIS — Z7984 Long term (current) use of oral hypoglycemic drugs: Secondary | ICD-10-CM | POA: Diagnosis not present

## 2023-12-17 DIAGNOSIS — K760 Fatty (change of) liver, not elsewhere classified: Secondary | ICD-10-CM | POA: Diagnosis not present

## 2023-12-20 DIAGNOSIS — E1310 Other specified diabetes mellitus with ketoacidosis without coma: Secondary | ICD-10-CM | POA: Diagnosis not present

## 2023-12-20 DIAGNOSIS — E872 Acidosis, unspecified: Secondary | ICD-10-CM | POA: Diagnosis not present

## 2023-12-20 DIAGNOSIS — F10130 Alcohol abuse with withdrawal, uncomplicated: Secondary | ICD-10-CM | POA: Diagnosis not present

## 2023-12-20 DIAGNOSIS — K852 Alcohol induced acute pancreatitis without necrosis or infection: Secondary | ICD-10-CM | POA: Diagnosis not present

## 2023-12-21 DIAGNOSIS — K852 Alcohol induced acute pancreatitis without necrosis or infection: Secondary | ICD-10-CM | POA: Diagnosis not present

## 2023-12-21 DIAGNOSIS — R030 Elevated blood-pressure reading, without diagnosis of hypertension: Secondary | ICD-10-CM | POA: Diagnosis not present

## 2023-12-21 DIAGNOSIS — E119 Type 2 diabetes mellitus without complications: Secondary | ICD-10-CM | POA: Diagnosis not present

## 2023-12-21 DIAGNOSIS — E872 Acidosis, unspecified: Secondary | ICD-10-CM | POA: Diagnosis not present

## 2023-12-22 DIAGNOSIS — K852 Alcohol induced acute pancreatitis without necrosis or infection: Secondary | ICD-10-CM | POA: Diagnosis not present

## 2023-12-22 DIAGNOSIS — D696 Thrombocytopenia, unspecified: Secondary | ICD-10-CM | POA: Diagnosis not present

## 2023-12-22 DIAGNOSIS — E119 Type 2 diabetes mellitus without complications: Secondary | ICD-10-CM | POA: Diagnosis not present

## 2023-12-22 DIAGNOSIS — E872 Acidosis, unspecified: Secondary | ICD-10-CM | POA: Diagnosis not present

## 2023-12-28 DIAGNOSIS — E119 Type 2 diabetes mellitus without complications: Secondary | ICD-10-CM | POA: Diagnosis not present

## 2023-12-28 DIAGNOSIS — Z0131 Encounter for examination of blood pressure with abnormal findings: Secondary | ICD-10-CM | POA: Diagnosis not present

## 2023-12-28 DIAGNOSIS — Z1389 Encounter for screening for other disorder: Secondary | ICD-10-CM | POA: Diagnosis not present

## 2023-12-28 DIAGNOSIS — Z1331 Encounter for screening for depression: Secondary | ICD-10-CM | POA: Diagnosis not present

## 2024-01-03 DIAGNOSIS — F339 Major depressive disorder, recurrent, unspecified: Secondary | ICD-10-CM | POA: Diagnosis not present

## 2024-01-11 DIAGNOSIS — F339 Major depressive disorder, recurrent, unspecified: Secondary | ICD-10-CM | POA: Diagnosis not present

## 2024-01-17 DIAGNOSIS — L700 Acne vulgaris: Secondary | ICD-10-CM | POA: Diagnosis not present

## 2024-01-17 DIAGNOSIS — L739 Follicular disorder, unspecified: Secondary | ICD-10-CM | POA: Diagnosis not present

## 2024-02-24 DIAGNOSIS — F339 Major depressive disorder, recurrent, unspecified: Secondary | ICD-10-CM | POA: Diagnosis not present

## 2024-04-13 DIAGNOSIS — G4700 Insomnia, unspecified: Secondary | ICD-10-CM | POA: Diagnosis not present

## 2024-04-13 DIAGNOSIS — K859 Acute pancreatitis without necrosis or infection, unspecified: Secondary | ICD-10-CM | POA: Diagnosis not present

## 2024-04-13 DIAGNOSIS — K760 Fatty (change of) liver, not elsewhere classified: Secondary | ICD-10-CM | POA: Diagnosis not present

## 2024-04-13 DIAGNOSIS — Z794 Long term (current) use of insulin: Secondary | ICD-10-CM | POA: Diagnosis not present

## 2024-04-13 DIAGNOSIS — F419 Anxiety disorder, unspecified: Secondary | ICD-10-CM | POA: Diagnosis not present

## 2024-04-13 DIAGNOSIS — F1090 Alcohol use, unspecified, uncomplicated: Secondary | ICD-10-CM | POA: Diagnosis not present

## 2024-04-13 DIAGNOSIS — Z888 Allergy status to other drugs, medicaments and biological substances status: Secondary | ICD-10-CM | POA: Diagnosis not present

## 2024-04-13 DIAGNOSIS — F329 Major depressive disorder, single episode, unspecified: Secondary | ICD-10-CM | POA: Diagnosis not present

## 2024-04-13 DIAGNOSIS — D696 Thrombocytopenia, unspecified: Secondary | ICD-10-CM | POA: Diagnosis not present

## 2024-04-13 DIAGNOSIS — I10 Essential (primary) hypertension: Secondary | ICD-10-CM | POA: Diagnosis not present

## 2024-04-13 DIAGNOSIS — Z7984 Long term (current) use of oral hypoglycemic drugs: Secondary | ICD-10-CM | POA: Diagnosis not present

## 2024-04-13 DIAGNOSIS — Z79899 Other long term (current) drug therapy: Secondary | ICD-10-CM | POA: Diagnosis not present

## 2024-04-13 DIAGNOSIS — E8729 Other acidosis: Secondary | ICD-10-CM | POA: Diagnosis not present

## 2024-04-13 DIAGNOSIS — Z716 Tobacco abuse counseling: Secondary | ICD-10-CM | POA: Diagnosis not present

## 2024-04-13 DIAGNOSIS — E785 Hyperlipidemia, unspecified: Secondary | ICD-10-CM | POA: Diagnosis not present

## 2024-04-13 DIAGNOSIS — F10230 Alcohol dependence with withdrawal, uncomplicated: Secondary | ICD-10-CM | POA: Diagnosis not present

## 2024-04-13 DIAGNOSIS — E119 Type 2 diabetes mellitus without complications: Secondary | ICD-10-CM | POA: Diagnosis not present

## 2024-04-13 DIAGNOSIS — R7989 Other specified abnormal findings of blood chemistry: Secondary | ICD-10-CM | POA: Diagnosis not present

## 2024-04-13 DIAGNOSIS — K852 Alcohol induced acute pancreatitis without necrosis or infection: Secondary | ICD-10-CM | POA: Diagnosis not present

## 2024-04-13 DIAGNOSIS — R410 Disorientation, unspecified: Secondary | ICD-10-CM | POA: Diagnosis not present

## 2024-04-13 DIAGNOSIS — R7401 Elevation of levels of liver transaminase levels: Secondary | ICD-10-CM | POA: Diagnosis not present

## 2024-04-13 DIAGNOSIS — F17290 Nicotine dependence, other tobacco product, uncomplicated: Secondary | ICD-10-CM | POA: Diagnosis not present

## 2024-04-14 DIAGNOSIS — F1090 Alcohol use, unspecified, uncomplicated: Secondary | ICD-10-CM | POA: Diagnosis not present

## 2024-04-14 DIAGNOSIS — K859 Acute pancreatitis without necrosis or infection, unspecified: Secondary | ICD-10-CM | POA: Diagnosis not present

## 2024-04-14 DIAGNOSIS — R7401 Elevation of levels of liver transaminase levels: Secondary | ICD-10-CM | POA: Diagnosis not present

## 2024-04-14 DIAGNOSIS — K852 Alcohol induced acute pancreatitis without necrosis or infection: Secondary | ICD-10-CM | POA: Diagnosis not present

## 2024-04-14 DIAGNOSIS — F329 Major depressive disorder, single episode, unspecified: Secondary | ICD-10-CM | POA: Diagnosis not present

## 2024-04-15 DIAGNOSIS — F329 Major depressive disorder, single episode, unspecified: Secondary | ICD-10-CM | POA: Diagnosis not present

## 2024-04-15 DIAGNOSIS — K859 Acute pancreatitis without necrosis or infection, unspecified: Secondary | ICD-10-CM | POA: Diagnosis not present

## 2024-04-15 DIAGNOSIS — R7401 Elevation of levels of liver transaminase levels: Secondary | ICD-10-CM | POA: Diagnosis not present

## 2024-04-15 DIAGNOSIS — E872 Acidosis, unspecified: Secondary | ICD-10-CM | POA: Diagnosis not present

## 2024-04-16 DIAGNOSIS — F329 Major depressive disorder, single episode, unspecified: Secondary | ICD-10-CM | POA: Diagnosis not present

## 2024-04-16 DIAGNOSIS — K859 Acute pancreatitis without necrosis or infection, unspecified: Secondary | ICD-10-CM | POA: Diagnosis not present

## 2024-04-16 DIAGNOSIS — R7401 Elevation of levels of liver transaminase levels: Secondary | ICD-10-CM | POA: Diagnosis not present

## 2024-04-16 DIAGNOSIS — F1090 Alcohol use, unspecified, uncomplicated: Secondary | ICD-10-CM | POA: Diagnosis not present

## 2024-04-17 DIAGNOSIS — R7401 Elevation of levels of liver transaminase levels: Secondary | ICD-10-CM | POA: Diagnosis not present

## 2024-04-17 DIAGNOSIS — F1090 Alcohol use, unspecified, uncomplicated: Secondary | ICD-10-CM | POA: Diagnosis not present

## 2024-04-17 DIAGNOSIS — K859 Acute pancreatitis without necrosis or infection, unspecified: Secondary | ICD-10-CM | POA: Diagnosis not present

## 2024-04-17 DIAGNOSIS — F329 Major depressive disorder, single episode, unspecified: Secondary | ICD-10-CM | POA: Diagnosis not present

## 2024-05-15 DIAGNOSIS — Z1389 Encounter for screening for other disorder: Secondary | ICD-10-CM | POA: Diagnosis not present

## 2024-05-15 DIAGNOSIS — Z0130 Encounter for examination of blood pressure without abnormal findings: Secondary | ICD-10-CM | POA: Diagnosis not present

## 2024-05-15 DIAGNOSIS — Z0131 Encounter for examination of blood pressure with abnormal findings: Secondary | ICD-10-CM | POA: Diagnosis not present

## 2024-05-15 DIAGNOSIS — E119 Type 2 diabetes mellitus without complications: Secondary | ICD-10-CM | POA: Diagnosis not present

## 2024-05-15 DIAGNOSIS — Z712 Person consulting for explanation of examination or test findings: Secondary | ICD-10-CM | POA: Diagnosis not present
